# Patient Record
Sex: Female | Born: 1985 | Race: Black or African American | Hispanic: No | Marital: Single | Smoking: Never smoker
Health system: Southern US, Community
[De-identification: ages and names within clinical notes are randomized; demographics above are authoritative.]

## PROBLEM LIST (undated history)

## (undated) DIAGNOSIS — N289 Disorder of kidney and ureter, unspecified: Secondary | ICD-10-CM

---
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
## 2006-02-09 ENCOUNTER — Ambulatory Visit: Payer: Self-pay | Admitting: Family Medicine

## 2006-02-18 ENCOUNTER — Ambulatory Visit: Payer: Self-pay | Admitting: Family Medicine

## 2006-02-18 ENCOUNTER — Inpatient Hospital Stay (HOSPITAL_COMMUNITY): Admission: AD | Admit: 2006-02-18 | Discharge: 2006-02-22 | Payer: Self-pay | Admitting: Family Medicine

## 2006-02-23 IMAGING — US US OB FOLLOW-UP
1 series · 13 of 28 positions shown · non-contrast
Comparison: none

CLINICAL DATA: 39 week 0 day assigned gestational age.  Small habitus.  Measuring small for dates.

[Series 1: us ob follow-up · 0.33mm/px · 13 of 28 slices shown]
[im 2/28]
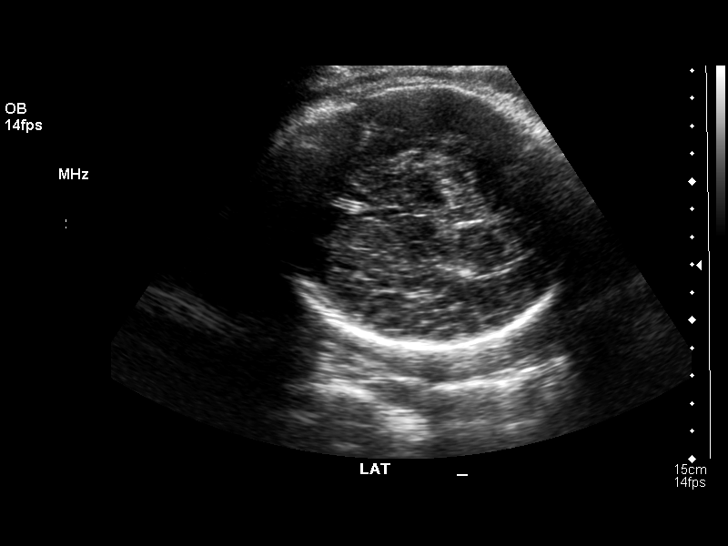
[im 4/28]
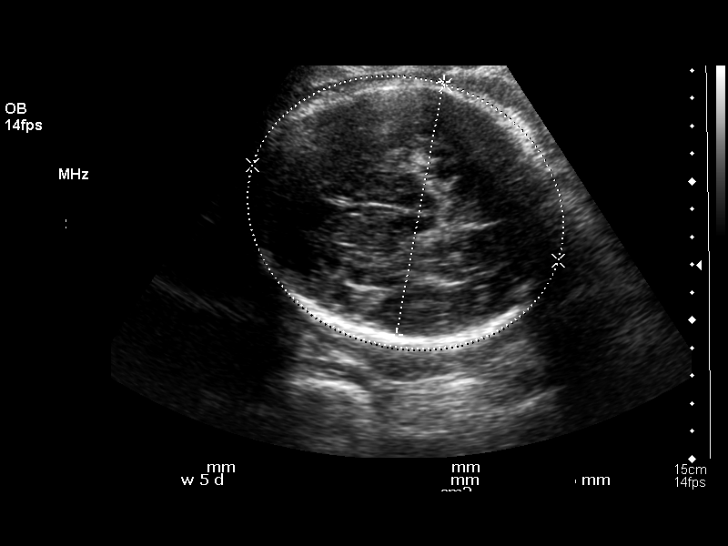
[im 6/28]
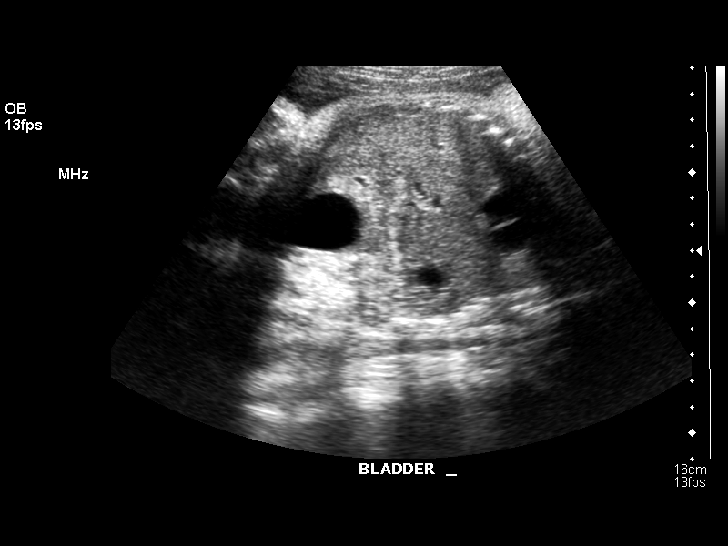
[im 8/28]
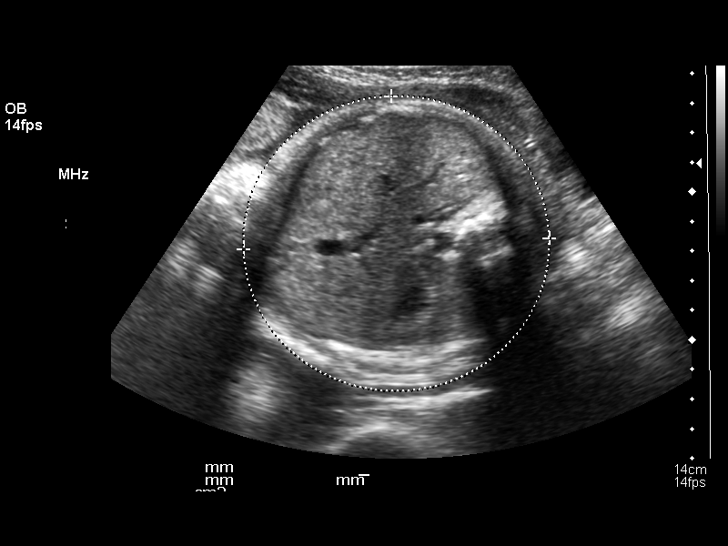
[im 10/28]
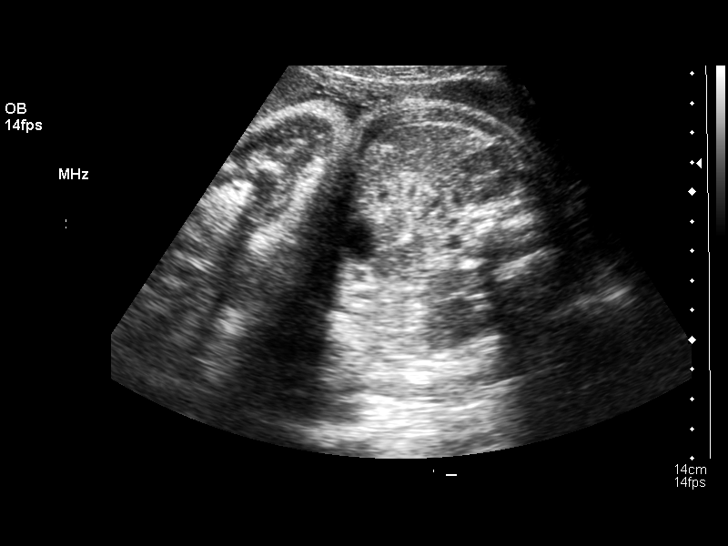
[im 12/28]
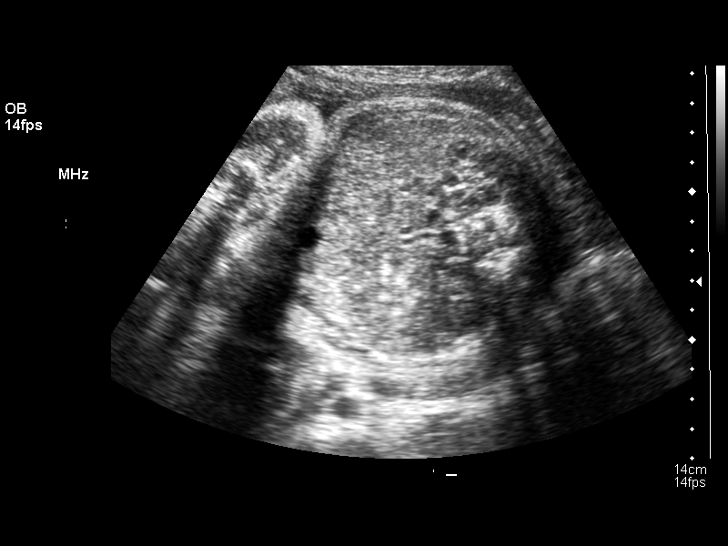
[im 15/28]
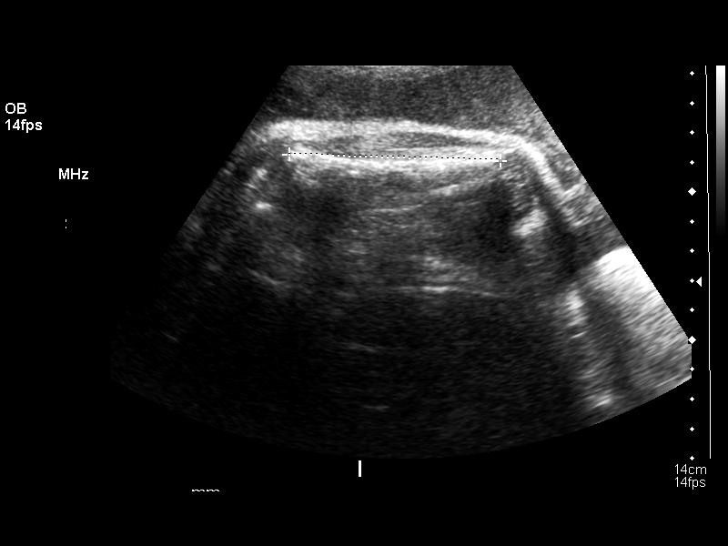
[im 17/28]
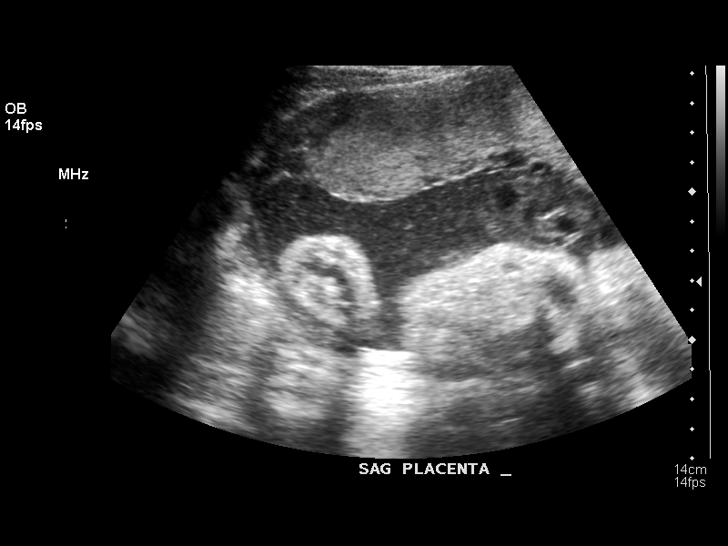
[im 19/28]
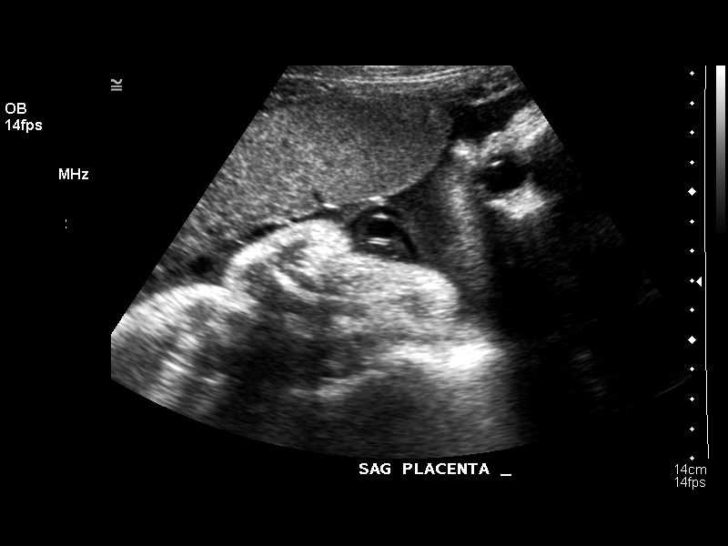
[im 21/28]
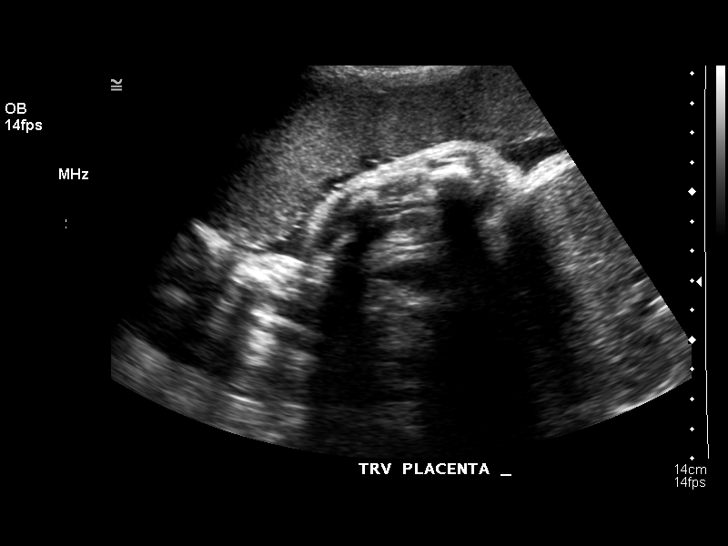
[im 23/28]
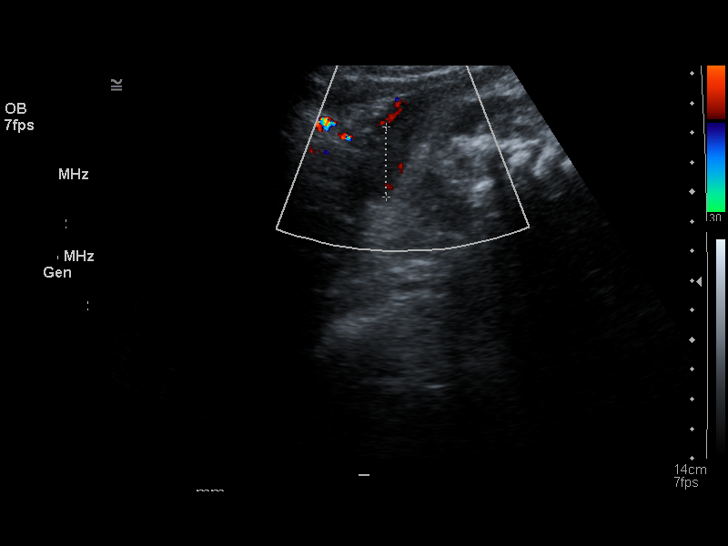
[im 25/28]
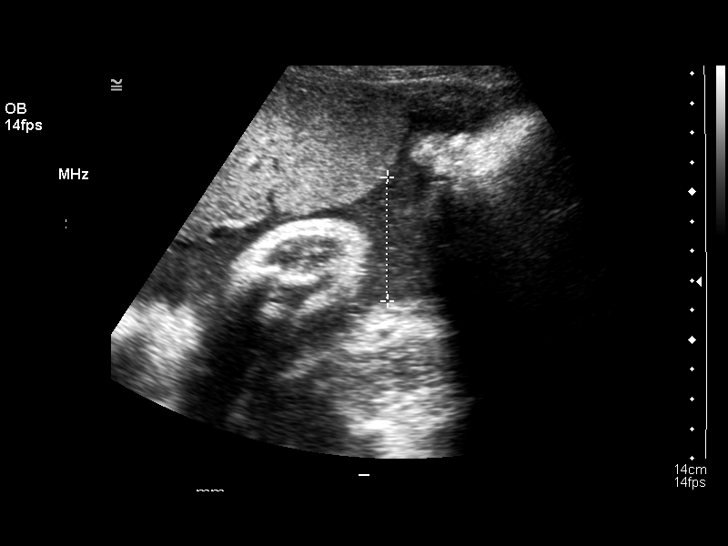
[im 27/28]
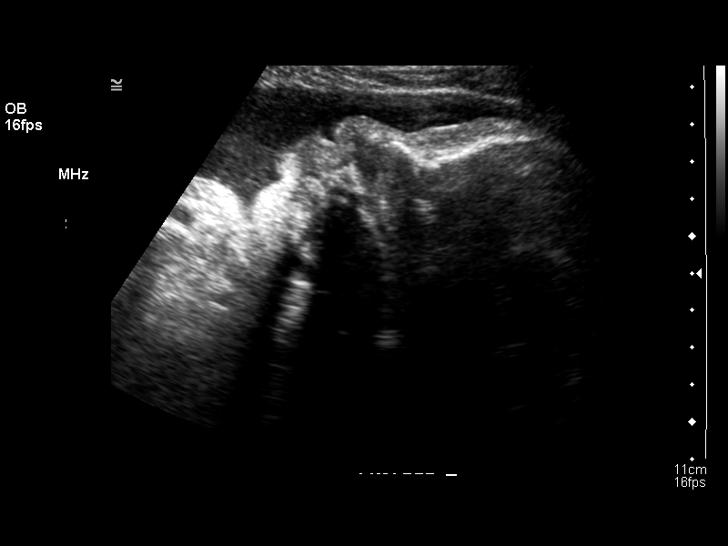

[13 of 28 positions shown; findings below may reference images not displayed]

OBSTETRICAL ULTRASOUND RE-EVALUATION:
Number of Fetuses:  1
Heart Rate:  160
Movement:  Yes
Breathing:  No
Presentation:  Cephalic
Placental Location:  Anterior
Grade: I
Previa:  No
Amniotic Fluid (subjective):  Normal
Amniotic Fluid (objective):  12.9 cm AFI (5th -95th%ile = 7.2 ? 22.6 cm for 39 wks)

FETAL BIOMETRY
BPD:  9.3 cm  37 w 6 d
HC:  33.9 cm   38 w 6 d
AC:  32.3 cm   36 w 2 d
FL:  7.1 cm  36 w 2 d

Mean GA:  37 w 2 d  US EDC:  12/22/05
Assigned GA:  39 w 0 d  Assigned EDC:  12/10/05

EFW:  9223 g (H) 25th ? 50th%ile (9404 ? 7700 g) For 39 wks

FETAL ANATOMY
Lateral Ventricles:  Visualized 
Thalami/CSP:  Visualized 
Posterior Fossa:  Previously seen 
Nuchal Region:  Previously seen 
Spine:  Previously seen 
4 Chamber Heart on Left:  Previously seen 
Stomach on Left:  Visualized 
3 Vessel Cord:  Previously seen 
Cord Insertion Site:  Previously seen 
Kidneys:  Visualized 
Bladder:  Visualized 
Extremities:  Previously seen 

MATERNAL UTERINE AND ADNEXAL FINDINGS
Cervix:  Not evaluated > 37 wks
IMPRESSION: 1.  Assigned gestational age is currently 39 weeks 0 days.  Today?s measurements are approximately two weeks behind, with EFW at 25th percentile.
2.  Amniotic fluid volume within normal limits with AFI of 12.9 cm.

## 2006-08-21 ENCOUNTER — Emergency Department (HOSPITAL_COMMUNITY): Admission: EM | Admit: 2006-08-21 | Discharge: 2006-08-21 | Payer: Self-pay | Admitting: Emergency Medicine

## 2006-08-24 ENCOUNTER — Ambulatory Visit: Payer: Self-pay | Admitting: Family Medicine

## 2007-08-03 ENCOUNTER — Telehealth (INDEPENDENT_AMBULATORY_CARE_PROVIDER_SITE_OTHER): Payer: Self-pay | Admitting: *Deleted

## 2007-08-03 ENCOUNTER — Ambulatory Visit: Payer: Self-pay | Admitting: Sports Medicine

## 2007-09-17 ENCOUNTER — Emergency Department (HOSPITAL_COMMUNITY): Admission: EM | Admit: 2007-09-17 | Discharge: 2007-09-17 | Payer: Self-pay | Admitting: Emergency Medicine

## 2007-09-19 ENCOUNTER — Telehealth: Payer: Self-pay | Admitting: *Deleted

## 2007-12-15 ENCOUNTER — Ambulatory Visit: Payer: Self-pay | Admitting: Family Medicine

## 2007-12-15 ENCOUNTER — Telehealth: Payer: Self-pay | Admitting: *Deleted

## 2007-12-15 ENCOUNTER — Encounter (INDEPENDENT_AMBULATORY_CARE_PROVIDER_SITE_OTHER): Payer: Self-pay | Admitting: Family Medicine

## 2007-12-15 LAB — CONVERTED CEMR LAB: Rapid Strep: POSITIVE

## 2008-06-14 ENCOUNTER — Telehealth (INDEPENDENT_AMBULATORY_CARE_PROVIDER_SITE_OTHER): Payer: Self-pay | Admitting: *Deleted

## 2008-09-10 ENCOUNTER — Encounter: Payer: Self-pay | Admitting: Family Medicine

## 2008-09-10 ENCOUNTER — Encounter (INDEPENDENT_AMBULATORY_CARE_PROVIDER_SITE_OTHER): Payer: Self-pay | Admitting: Family Medicine

## 2008-09-10 ENCOUNTER — Other Ambulatory Visit: Admission: RE | Admit: 2008-09-10 | Discharge: 2008-09-10 | Payer: Self-pay | Admitting: Family Medicine

## 2008-09-10 ENCOUNTER — Ambulatory Visit: Payer: Self-pay | Admitting: Family Medicine

## 2008-09-10 LAB — CONVERTED CEMR LAB
Blood in Urine, dipstick: NEGATIVE
Urobilinogen, UA: 0.2
pH: 6

## 2008-09-11 LAB — CONVERTED CEMR LAB: Chlamydia, DNA Probe: NEGATIVE

## 2008-09-15 ENCOUNTER — Emergency Department (HOSPITAL_COMMUNITY): Admission: EM | Admit: 2008-09-15 | Discharge: 2008-09-15 | Payer: Self-pay | Admitting: Emergency Medicine

## 2008-09-17 ENCOUNTER — Telehealth (INDEPENDENT_AMBULATORY_CARE_PROVIDER_SITE_OTHER): Payer: Self-pay | Admitting: *Deleted

## 2008-09-17 ENCOUNTER — Ambulatory Visit: Payer: Self-pay | Admitting: Family Medicine

## 2008-11-01 ENCOUNTER — Telehealth: Payer: Self-pay | Admitting: Family Medicine

## 2009-06-27 ENCOUNTER — Emergency Department (HOSPITAL_COMMUNITY): Admission: EM | Admit: 2009-06-27 | Discharge: 2009-06-27 | Payer: Self-pay | Admitting: Emergency Medicine

## 2009-07-12 ENCOUNTER — Emergency Department (HOSPITAL_COMMUNITY): Admission: EM | Admit: 2009-07-12 | Discharge: 2009-07-13 | Payer: Self-pay | Admitting: Emergency Medicine

## 2009-12-18 ENCOUNTER — Telehealth (INDEPENDENT_AMBULATORY_CARE_PROVIDER_SITE_OTHER): Payer: Self-pay | Admitting: *Deleted

## 2009-12-24 ENCOUNTER — Ambulatory Visit: Payer: Self-pay | Admitting: Family Medicine

## 2009-12-24 LAB — CONVERTED CEMR LAB
Bilirubin Urine: NEGATIVE
Nitrite: NEGATIVE
Specific Gravity, Urine: 1.025
WBC Urine, dipstick: NEGATIVE

## 2010-05-15 ENCOUNTER — Encounter: Payer: Self-pay | Admitting: Family Medicine

## 2010-06-30 ENCOUNTER — Emergency Department (HOSPITAL_COMMUNITY): Admission: EM | Admit: 2010-06-30 | Discharge: 2010-06-30 | Payer: Self-pay | Admitting: Emergency Medicine

## 2010-12-20 ENCOUNTER — Encounter: Payer: Self-pay | Admitting: Sports Medicine

## 2010-12-29 NOTE — Progress Notes (Signed)
Summary: Rx Req  Phone Note Refill Request Call back at Home Phone [phone] Message from:  Patient  Refills Requested: Medication #1:  KEFLEX 500 MG CAPS 1 tab by mouth two times a day for 5 days WONDERING IF SHE CAN GET AN RX FOR THIS HAVING SAME SYMPTOMS AS BEFORE WHEN SHE GOT THIS.  PT USES [facility] ON [address]  Initial call taken by: Clydell Hakim,  December 18, 2009 9:04 AM Caller: Patient  Follow-up for Phone Call        patient reports she is having back pain similar to before when she had UTI. states back sort of throbbs and feels hot. denies any discomfort with urination  or fever. appointment scheduled today for Work In appointment. Follow-up by: Theresia Lo RN,  December 18, 2009 11:14 AM

## 2010-12-29 NOTE — Assessment & Plan Note (Signed)
Summary: ?UTI, check IUD   Vital Signs:  Patient profile:   25 year old female Weight:      120.2 pounds BMI:     20.55 Temp:     98.2 degrees F oral Pulse rate:   71 / minute Pulse rhythm:   regular BP sitting:   113 / 80 Cuff size:   regular  Vitals Entered By: Loralee Pacas CMA (December 24, 2009 2:34 PM) CC: dysuria Comments would like for her IUD to be checked    Primary Care Provider:  Lequita Asal  MD  CC:  dysuria.  History of Present Illness: 25 y/o female with h/o pyelonephritis and numerous UTI who presents with lower back pain. denies fever, chills, dysuria, abdominal pain, N/V.   contraceptive mgmt- patient unable to feel IUD strings. would like it checked.   Current Medications (verified): 1)  None  Allergies (verified): No Known Drug Allergies  Past History:  Past medical history reviewed for relevance to current acute and chronic problems.  Past Medical History: Reviewed history from 09/10/2008 and no changes required. 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 vitals reviewed.  Abdomen:  Bowel sounds positive,abdomen soft and non-tender without masses, organomegaly or hernias noted. no CVA tenderness bilaterally Genitalia:  Normal introitus for age, no external lesions, blood in introitus. mucosa pink and moist, no vaginal or cervical lesions, no vaginal atrophy, no friaility or hemorrhage, normal uterus size and position, no adnexal masses or tenderness. IUD strings in place.    Impression & Recommendations:  Problem # 1:  BACK PAIN (ICD-724.5) Assessment New  u/a and history not consistent with UTI or pyelonephritis. patient with h/o renal colic and pyelo. red flags given. f/u as needed  Orders: [facility]- Est Level  3 (16109)  Problem #  2:  CONTRACEPTIVE MANAGEMENT (ICD-V25.09)  IUD strings visualized. IUD to be removed 2012.   Orders: [facility]- Est Level  3 (60454)  Other Orders: Urinalysis-[facility] (00000)  Patient Instructions: 1)  If you develop fever, chills, nausea, vomiting, severe back pain, pain with urination, or other concerns, let us know right away.   Laboratory Results   Urine Tests  Date/Time Received: December 24, 2009 2:35 PM  Date/Time Reported: December 24, 2009 3:27 PM   Routine Urinalysis   Color: yellow Appearance: Clear Glucose: negative   (Normal Range: Negative) Bilirubin: negative   (Normal Range: Negative) Ketone: trace (5)   (Normal Range: Negative) Spec. Gravity: 1.025   (Normal Range: 1.003-1.035) Blood: trace-lysed   (Normal Range: Negative) pH: 5.5   (Normal Range: 5.0-8.0) Protein: trace   (Normal Range: Negative) Urobilinogen: 1.0   (Normal Range: 0-1) Nitrite: negative   (Normal Range: Negative) Leukocyte Esterace: negative   (Normal Range: Negative)  Urine Microscopic WBC/HPF: 0-3 RBC/HPF: 1-5 Bacteria/HPF: 1+ Mucous/HPF: 2+ Epithelial/HPF: 10-20  with few clue appearing cells    Comments: ...............test performed by......Marland KitchenBonnie A. [place], MLS (ASCP)cm

## 2010-12-29 NOTE — Miscellaneous (Signed)
  Clinical Lists Changes  Problems: Removed problem of CONTRACEPTIVE MANAGEMENT (ICD-V25.09) Removed problem of BACK PAIN (ICD-724.5) Removed problem of GYNECOLOGICAL EXAMINATIONOUTINE (ICD-V72.31) Removed problem of SCREENING FOR MALIGNANT NEOPLASM(ICD-V76.2)

## 2011-01-20 ENCOUNTER — Encounter: Payer: Self-pay | Admitting: Sports Medicine

## 2011-01-20 ENCOUNTER — Ambulatory Visit (INDEPENDENT_AMBULATORY_CARE_PROVIDER_SITE_OTHER): Payer: Self-pay | Admitting: Sports Medicine

## 2011-01-20 VITALS — BP 113/72 | HR 85 | Temp 97.8°F | Ht 64.0 in | Wt 124.0 lb

## 2011-01-20 DIAGNOSIS — Z309 Encounter for contraceptive management, unspecified: Secondary | ICD-10-CM

## 2011-01-20 DIAGNOSIS — Z975 Presence of (intrauterine) contraceptive device: Secondary | ICD-10-CM | POA: Insufficient documentation

## 2011-01-20 NOTE — Progress Notes (Signed)
Pt arrived for mirena removal/insertion.  Medicaid auth not completed.  Pt agrees to return once completed.

## 2011-01-20 NOTE — Assessment & Plan Note (Signed)
Pt will RTC when authorization completed and I can remove old mirena and replace with new one.

## 2011-02-12 LAB — WET PREP, GENITAL: Trich, Wet Prep: NONE SEEN

## 2011-02-12 LAB — BASIC METABOLIC PANEL
BUN: 7 mg/dL (ref 6–23)
CO2: 25 mEq/L (ref 19–32)
Calcium: 8.7 mg/dL (ref 8.4–10.5)
Creatinine, Ser: 0.71 mg/dL (ref 0.4–1.2)
Glucose, Bld: 84 mg/dL (ref 70–99)

## 2011-02-12 LAB — URINALYSIS, ROUTINE W REFLEX MICROSCOPIC
Bilirubin Urine: NEGATIVE
Glucose, UA: NEGATIVE mg/dL
Hgb urine dipstick: NEGATIVE
Ketones, ur: NEGATIVE mg/dL
Nitrite: NEGATIVE
Protein, ur: NEGATIVE mg/dL
Specific Gravity, Urine: 1.015 (ref 1.005–1.030)
Urobilinogen, UA: 1 mg/dL (ref 0.0–1.0)
pH: 7 (ref 5.0–8.0)

## 2011-02-12 LAB — DIFFERENTIAL
Eosinophils Relative: 6 % — ABNORMAL HIGH (ref 0–5)
Monocytes Relative: 5 % (ref 3–12)
Neutro Abs: 7.1 10*3/uL (ref 1.7–7.7)
Neutrophils Relative %: 71 % (ref 43–77)

## 2011-02-12 LAB — GC/CHLAMYDIA PROBE AMP, GENITAL
Chlamydia, DNA Probe: NEGATIVE
GC Probe Amp, Genital: NEGATIVE

## 2011-02-12 LAB — CBC: WBC: 10.1 10*3/uL (ref 4.0–10.5)

## 2011-03-03 ENCOUNTER — Other Ambulatory Visit (HOSPITAL_COMMUNITY)
Admission: RE | Admit: 2011-03-03 | Discharge: 2011-03-03 | Disposition: A | Discharge status: Home / Self Care | Payer: Medicaid Other | Source: Ambulatory Visit | Attending: Family Medicine | Admitting: Family Medicine

## 2011-03-03 ENCOUNTER — Encounter: Payer: Self-pay | Admitting: Family Medicine

## 2011-03-03 ENCOUNTER — Ambulatory Visit (INDEPENDENT_AMBULATORY_CARE_PROVIDER_SITE_OTHER): Payer: Medicaid Other | Admitting: Family Medicine

## 2011-03-03 DIAGNOSIS — Z30433 Encounter for removal and reinsertion of intrauterine contraceptive device: Secondary | ICD-10-CM

## 2011-03-03 DIAGNOSIS — Z01419 Encounter for gynecological examination (general) (routine) without abnormal findings: Secondary | ICD-10-CM | POA: Insufficient documentation

## 2011-03-03 DIAGNOSIS — Z975 Presence of (intrauterine) contraceptive device: Secondary | ICD-10-CM

## 2011-03-03 DIAGNOSIS — Z3009 Encounter for other general counseling and advice on contraception: Secondary | ICD-10-CM

## 2011-03-03 DIAGNOSIS — Z124 Encounter for screening for malignant neoplasm of cervix: Secondary | ICD-10-CM

## 2011-03-03 MED ORDER — LEVONORGESTREL 20 MCG/24HR IU IUD
INTRAUTERINE_SYSTEM | Freq: Once | INTRAUTERINE | Status: AC
  Administered 2011-03-03: 1 via INTRAUTERINE

## 2011-03-03 NOTE — Assessment & Plan Note (Signed)
Mirena replaced today.  Pap performed.  See patient instructions.  Follow-up in 4 weeks for recheck

## 2011-03-03 NOTE — Progress Notes (Signed)
  Subjective:    Patient ID: [name], female    DOB: [birth year], 25 y.o.   MRN: [id number]  HPI  Here to replace mirena.  Had placed 5 years ago.  Has been happy.  Would like to replace with another Mirena.  Having monthly very light periods.  No pelvic pain.  No history of PID or STD's.    Review of Systems  see hpi    Objective:   Physical Exam  Constitutional: She appears well-developed and well-nourished.  Abdominal: Soft. Bowel sounds are normal. She exhibits no distension and no mass. There is no tenderness. There is no rebound and no guarding.  Genitourinary: Vagina normal and uterus normal. Cervix exhibits discharge. Cervix exhibits no motion tenderness and no friability. Right adnexum displays no mass, no tenderness and no fullness. Left adnexum displays no mass, no tenderness and no fullness. No tenderness or bleeding around the vagina. No vaginal discharge found.       Mirena strings seen exiting from cervical os          Assessment & Plan:  Placement of Mirena IUD  Indication: Contraception Assisted by: Huntley Dec CNA  Patient was counseled regarding the risks/benefits/alternatives of the IUD. Currently has a Mirena IUD in place. Consent was obtained and all questions answered.  Time out performed. Pap performed prior to procedure.  Description: Cervix was swabbed three times with Betadine swabs. Sterile gloves donned. Sterile single-tooth tenaculum used to grasp anterior lip of the cervix and straighten the endocervical canal. mirena strings grasps with ring forceps and removed. Uterus sounded to 7 cm. IUD loaded per manufacturer's instruction and flange set to 7 cm. IUD placed per manufacturer's directions. Strings trimmed to 4 cm. Patient tolerated the procedure well.    Follow-up: Patient counseled regarding techniques for self-monitoring of IUD and given precautions. Patient notified of removal date and given card. Patient will follow-up in 4 weeks  for string check.

## 2011-03-03 NOTE — Patient Instructions (Signed)
IUD AFTERCARE  1.  Uterine cramping is common after IUD placement.  You  May using heating pads, ibuprofen, and tylenol.  If it becomes very painful, you notice fever or chills, unusual bleeding, or foul smelling vaginal discharge please call the office. 2.  Irregular vaginal bleeding is common in the first few months after IUD placement but will often get better in the first six months.   3.  IUDs do not protect against STD such as HIV, genital warts, gonorrhea, chlamydia, herpes.  Please continue to protect yourself against these infections and contact the office if you think you may have contracted an infection. 4.  Checking for proper placement:  You may feel for your IUD strings as shown today by placing you fingers into your vagina.  Do not pull on the strings.  If your Mirena falls out, you can feel the hard plastic part of the IUD, or you can no longer feel the strings, please contact the office. 5.  Your Mirena should be removed or replaced in 5 years. 6.  Please see package insert or [email or web address] for full patient information. 7.  Make follow-up appointment for 4 weeks.  

## 2011-03-04 NOTE — Progress Notes (Signed)
Addended by: Delbert Harness on: 03/04/2011 09:20 AM   Modules accepted: Level of Service

## 2011-03-07 LAB — URINE MICROSCOPIC-ADD ON

## 2011-03-07 LAB — URINE CULTURE: Colony Count: 9000

## 2011-03-07 LAB — URINALYSIS, ROUTINE W REFLEX MICROSCOPIC
Bilirubin Urine: NEGATIVE
Glucose, UA: NEGATIVE mg/dL
Ketones, ur: NEGATIVE mg/dL
Ketones, ur: NEGATIVE mg/dL
Nitrite: NEGATIVE
Nitrite: NEGATIVE
Specific Gravity, Urine: 1.023 (ref 1.005–1.030)
Urobilinogen, UA: 1 mg/dL (ref 0.0–1.0)
Urobilinogen, UA: 1 mg/dL (ref 0.0–1.0)

## 2011-03-07 LAB — POCT I-STAT, CHEM 8
BUN: 10 mg/dL (ref 6–23)
Calcium, Ion: 1.16 mmol/L (ref 1.12–1.32)
Creatinine, Ser: 0.9 mg/dL (ref 0.4–1.2)
Glucose, Bld: 88 mg/dL (ref 70–99)
Potassium: 3.7 mEq/L (ref 3.5–5.1)
Sodium: 141 mEq/L (ref 135–145)
TCO2: 25 mmol/L (ref 0–100)

## 2011-03-07 LAB — PREGNANCY, URINE: Preg Test, Ur: NEGATIVE

## 2011-03-09 ENCOUNTER — Encounter: Payer: Self-pay | Admitting: Family Medicine

## 2011-04-16 NOTE — Discharge Summary (Signed)
NAMECASILDA, [name]                       ACCOUNT NO.:  [id number]   MEDICAL RECORD NO.:  [id number]                   PATIENT TYPE:  INP   LOCATION:  6153                                 FACILITY:  [facility]   PHYSICIAN:  Orie Rout, M.D.            DATE OF BIRTH:   DATE OF ADMISSION:  06/14/2003  DATE OF DISCHARGE:  06/24/2003                                 DISCHARGE SUMMARY   PRIMARY CARE PHYSICIAN:  Penni Bombard, M.D.   REFERRING PHYSICIAN:  None.   CONSULTING PHYSICIAN:  Dr. Dineen Kid, [facility].   FINAL DIAGNOSES:  1. Pyelonephritis.  2. Possible gram-negative urosepsis.  3. Possible toxic shock syndrome.  4. Ureteropelvic junction urethral stricture.  5. Rhabdomyolysis secondary to numbers 2 and 3.   PRINCIPAL PROCEDURES:  1. A renal ultrasound showing mild dilation of the collecting system     consistent with pelvic calyces or mild hydronephrosis.  It also showed     that the right kidney had a triangulated perinephritic fluid collection     which had apparent internal debris and that the left kidney was mildly     enlarged.  The fluid collection, according to radiology, was concerning     for an abscess.  2. Also abdominal and pelvic CT showing a left ureteropelvic junction     stenosis with secondary pyelocaliectasis with superimposed pyelonephritis     with edematous changes of the left kidney and wedge-shaped cortical     defects.   ADMISSION HISTORY AND PHYSICAL:  Please see records.   LABORATORY DATA:  At the time of discharge the patient had a CK value of  8,232 and a creatinine of 0.9, and her white blood cell count had  normalized.   HOSPITAL COURSE:  Ms. [name] is a 25 year old African-American  female who presented with crampy pain two days prior to admission.  She had  had none episode of encopresis described by mom as being coffee-ground in  appearance.  She had also had one episode of encopresis two days prior to  admission.  She reported fevers to 24 at the day of admission and these  fevers were accompanied with a sharp, colicky pain in the left stomach that  seemed to radiate around to her back.  She endorsed decreased appetite and  decreased urination; denied pain with urination, denied seeing any blood in  her urine or stool.  Endorsed good genital hygiene and states that she wipes  from front to back.  She was not sexually active.  Endorsed fatigue with no  cough, no sore throat, no congestion, no joint pain.  On physical exam had  significant CVA tenderness and was febrile to 24.4.  A UA performed at the  time of admission showed positive ketones, positive protein, positive  nitrates, positive leukocyte esterase, positive bacteria.  Negative for  glucose.  She had a negative pregnancy test and had a WBC on presentation of  29.7.  with predominant neutrophils.  Her creatinine at admission 1.8 so she  was started on ceftriaxone and was doing fine during the evening of the day  after admission when she spiked to 103 and became hypotensive.  She at this  point in time was transferred to the PICU where she was aggressively  rehydrated and required several hours worth of epinephrine drip in order to  raise her blood pressure to an adequate level.  During the time in the PICU  on the epinephrine drip she did have several-minute runs of V-TACH which  resolved after stopping the epinephrine drip.  On transfer to the PICU,  additional antibiotics were started.  She was placed on gentamycin and  doxycycline and also clindamycin with the thought of possible toxic shock  syndrome.  At the time when she was transferred to the PICU, it was found  that her platelets had dropped from 126 to 98, she had a fibrinogen leve of  693 and a D-dimer of 3.43, a PT of 13.1, an INR of 1.1, and a PTT of 46.  Her CK at that time was 7956 and a CRP of 197.5.  It was thought at this  point that she either had gram-negative  sepsis or toxic shock syndrome,  however, her blood cultures never grew out any bacteria.  Her urine did grow  E. coli.  Also in the PICU she developed an oxygen requirement and had a  chest x-ray performed that showed a left pleural effusion involving almost  the entire left lung and was given several doses of Lasix which produced a  large diuresis and decreased her oxygen requirement.  After several days in  the PICU, she began to clinically improve and was transferred back to the  floor at which time her CK continued to climb and reached a peak of 32,000.  Aggressive hydration was provided as well as alkalinization of the urine.  Her acute renal failure resolved with a peak creatinine being 2.2 and was  0.9 at the time of discharge.  Three days before discharge, a pelvic and  abdominal CT was performed showing the UPJ stenosis.  At this time a urology  consult was performed.  The urologist believes that it was not necessary to  stent the stricture, but that he would like to follow her up as an  outpatient.  He felt very comfortable with her going home on p.o.  antibiotics.  On the day of discharge the patient had no CVA tenderness, had  been afebrile for nearly 72 hours, was tolerating p.o. intake well, and the  renal function was greatly improved and back to baseline at 0.9.  CK had  decreased remarkably down to 3232, and at that time all other antibiotics  had been discontinued and she was tolerating oral ciprofloxacin well.   INSTRUCTIONS TO PATIENT AND FAMILY:  The patient's mother was instructed to  return to clinic or the ED if the patient developed a fever, recurrent flank  pain or became otherwise ill appearing.  She was instructed to take  ciprofloxacin 500 mg p.o. b.i.d. for 10 days.  She was instructed to  continue good hydration and to maintain a normal diet.   FOLLOW UP:  The patient will follow up with myself, Dr. Melvyn Novas, on this Friday, June 28, 2003, at 3:45.  The  patient will also schedule an  appointment with Dr. Dineen Kid in urology in one month for further  evaluation of her stricture.  DISCHARGE MEDICATIONS:  Ciprofloxacin 500 mg p.o. b.i.d. x10d.   ADVANCED DIRECTIVE:  None.   DATA REVIEWED:  Full Code.      Penni Bombard, MD                          Orie Rout, M.D.    SJ/MEDQ  D:  06/24/2003  T:  06/24/2003  Job:  [id number]   cc:   Dr. Franc[facility]

## 2011-04-16 NOTE — Discharge Summary (Signed)
[name], [name]             ACCOUNT NO.:  [id number]   MEDICAL RECORD NO.:  [id number]          PATIENT TYPE:  INP   LOCATION:  5033                         FACILITY:  [facility]   PHYSICIAN:  Benn Moulder, M.D.      DATE OF BIRTH:  [birth year]   DATE OF ADMISSION:  02/18/2006  DATE OF DISCHARGE:  02/22/2006                                 DISCHARGE SUMMARY   DISCHARGE DIAGNOSES:  1.  Pyelonephritis secondary to Escherichia coli.  2.  Ureteropelvic junction stenosis.  3.  Post-partum and breast feeding.  4.  Hypokalemia.  5.  Dehydration.   DISCHARGE MEDICATIONS:  1.  Amoxicillin 500 mg t.i.d. through March 04, 2006.  2.  Prenatal vitamin once daily while breast feeding.  3.  Tylenol 650 mg p.o. q.6 h. p.r.n. pain.   LABORATORY DATA:  On admission, white blood cell count 17.2, hemoglobin  12.2, hematocrit 35.4, platelet count 153. Sodium 136, potassium 2.9,  chloride 103, bicarb 25, BUN 10, creatinine 1.0, glucose 100. LFTs were  within normal limits. Urine Gram's stain positive for Gram-negative rods.  Urine culture was positive for E. coli, pansensitive. White blood cell count  at discharge 6.9, hemoglobin at discharge 11.3, creatinine at discharge was  0.8.   HISTORY OF PRESENT ILLNESS:  The patient is an 25 year old female with a  history of pyelonephritis x2, as well as a previous episode of septic shock  secondary to E. coli with her first admission for pyelonephritis, who is  also noted to have left-sided UPJ stenosis, who presented to the [facility] with fevers, right-sided low back pain, and cloudy urine.  She also had nausea, vomiting, fever, chills, and was unable to tolerate any  foods or liquids. She is febrile on admission with a temperature of 102.5.  Urine had 10 to 20 white blood cells. The patient was admitted to the [facility] for diagnosis of pyelonephritis.   HOSPITAL COURSE BY PROBLEM:  #1.  PYELONEPHRITIS:  The patient  remained  febrile, reaching a temperature max of 103.8. She was started on ceftriaxone  1 gram IV q.24 h. The patient's fever curve gradually decreased with the  patient's last fever at 2:00 p.m. on February 20, 2006. The patient remained  afebrile throughout the remainder of her hospital course. The patient also  with significant nausea and vomiting on admission. This gradually improved  and for the last 2 days of admission, the patient was tolerating a regular  diet. The patient did receive Zofran as needed during the first couple of  days of her admission. The patient also with left-sided flank pain, which  again continued to improve and was gone by the time of her discharge. The  patient was continued on IV ceftriaxone until urine cultures were returned.  The patient's urine culture was positive for E. coli and was pansensitive.  The patient was changed to amoxicillin on the day of discharge. The patient  will need to complete a 2-week course of antibiotics and will take  amoxicillin 500 mg t.i.d. as an outpatient. Given the patient's history of  multiple pyelonephritis as well as UPJ stenosis, would recommend followup  with Urology as an outpatient. Also will consider prophylactic antibiotic  therapy as an outpatient.   #2.  POSTPARTUM:  The patient with a healthy young boy. She is breast  feeding. She continued breast feeding and pumping during this hospital stay.  The patient was instructed to continue taking multivitamin while breast  feeding.   #3.  HYPOKALEMIA:  The patient's potassium of 2.9 on admission. This was  replaced orally and remained stable throughout the rest of her hospital  admission.   #4.  FLUID AND ELECTROLYTES:  The patient with nausea and vomiting  initially. At the point of discharge, the patient was tolerating a regular  diet with good oral intake and urine output.   FOLLOW UP:  Dr. Melvyn Novas at [facility]. Phone number [phone]. The  patient will call for an appointment.      Benn Moulder, M.D.     MR/MEDQ  D:  02/22/2006  T:  02/23/2006  Job:  [id number]   cc:   Pearlean Brownie, M.D.  Fax: [phone]   Penni Bombard, MD  Fax: [phone]

## 2011-04-16 NOTE — Discharge Summary (Signed)
Nguyen, [name]             ACCOUNT NO.:  [id number]   MEDICAL RECORD NO.:  [id number]          PATIENT TYPE:  INP   LOCATION:  9309                          FACILITY:  [facility]   PHYSICIAN:  Penni Bombard, MD       DATE OF BIRTH:  1986-11-13   DATE OF ADMISSION:  05/28/2005  DATE OF DISCHARGE:  05/30/2005                                 DISCHARGE SUMMARY   FINAL DIAGNOSES:  1.  Left-sided pyelonephritis.  2.  Intrauterine pregnancy at 12-2/7 weeks.   PRINCIPAL PROCEDURE:  Renal ultrasound showing moderate left renal  pelvocaliectasis which is a new finding since her last renal ultrasound.   ADMISSION HISTORY AND PHYSICAL:  Please see chart for full details.  However, in short this is a 25 year old [place] American female with history  of UPJ stenosis with shock in 2004.  The patient developed fever to 102.7 on  Wednesday with nausea and left lower quadrant pain.  The patient had prior  urine culture on June 19, but did not fill her antibiotic prescription. The  patient had a history of septic shock and ventricular fibrillation with her  last episode of pyelonephritis in July 2004.  The patient presented to my  office on the day of admission, June 30, with complain of left lower  quadrant abdominal pain and left-sided pain with inspiration.  She had been  taking persistent Tylenol when I had seen her and was currently afebrile.  The patient's weight was down three pounds.  The denied dysuria or frequency  or urgency but had not had any other previous UTIs either.   LABORATORY DATA:  WBC 11.3, hemoglobin 10.7, hematocrit 31.9, platelets 258.  Sodium 140, potassium 3.1, chloride 100, CO2 27, BUN 5, creatinine 0.5, AST  10, ALT 8, alk phos 67, total bilirubin 0.7, total protein 6.7, albumin 2.8.  Urinalysis at the hospital reveals negative nitrites, small leukocyte  esterase, ketones greater than 80, bloods negative, wbc's 11-20, bacteria  few. Urine culture was no growth to date at  the time of discharge.   HOSPITAL COURSE:  1.  Pyelonephritis.  The patient was admitted and started on ceftriaxone IV      1 g q.12h. and given aggressive IV hydration.  She was given Phenergan      for nausea and vomiting.  By day 2, she was able to eat a regular diet      and she was continued on ceftriaxone.  She remained afebrile through her      hospital course.  Renal ultrasound revealed calyx dilatation on the      left.  We spoke with urology who stated that they would not currently      stunt the patient but would like her to follow up with them as an      outpatient.  By the end of day  of hospitalizations, she continued to be      afebrile and was discharged on a course of Ceftin for a total of a 14-      day course.  At the time of discharge the patient's pain  had completely      dissipated.  The patient will continue on suppression course of Macrobid      through the remainder of her pregnancy.  2.  Intrauterine pregnancy.  The patient's fetal heart rate was monitored      throughout the pregnancy. Fetal heart tones were in the 170's to 180's.      She had no bleeding or leakage of fluids throughout the hospitalization.   DISCHARGE INSTRUCTIONS:  Instructions to the patient and family:  The  patient was instructed to check her temperature every four hours for the  next several days except while sleeping.  If she had a temperature of  greater than 100, she was to return to the hospital for repeat admission.  The patient was instructed to follow up with her urologist in the next one  to two weeks.  She was also instructed to follow up with myself, Dr. Penni Bombard, within three weeks.  She was instructed to complete a total of 14  days on Ceftin and then the continue on suppressive therapy with Macrobid.   DISCHARGE MEDICATIONS:  1.  Ceftin 500 mg one tablet p.o. b.i.d. for 12 days.  2.  Macrobid SR 100 mg one tablet p.o. daily to be started after completion      of the  Ceftin.       SJ/MEDQ  D:  05/30/2005  T:  05/30/2005  Job:  [id number]   cc:   [facility] [place]

## 2011-04-16 NOTE — H&P (Signed)
[name], [name]             ACCOUNT NO.:  [id number]   MEDICAL RECORD NO.:  [id number]          PATIENT TYPE:  INP   LOCATION:  9399                          [facility]ACILITY:  [facility]   PHYSICIAN:  Penni Bombard, MD       DATE OF BIRTH:  1985/12/15   DATE OF ADMISSION:  05/28/2005  DATE OF DISCHARGE:                                HISTORY & PHYSICAL   CHIEF COMPLAINT:  Abdominal pain.   HISTORY OF PRESENT ILLNESS:  This is a 25 year old African-American female  with history of UPJ stenosis and severe pyelo and history of a shock.  The  patient developed fever to 102.7 on Wednesday with nausea and vomiting and  left lower quadrant pain.  The patient had positive urine culture on June 19  but did not fill antibiotic prescription.  The patient has history of a  __________ with septic shock with last episode of pyelo in July 2004.  The  patient presents today with complaint of left lower quadrant abdominal pain  and pain with inspiration.  She has been taking persistent Tylenol, and her  weight down is down approximately 3 pounds.  She denies any dysuria,  frequency, and urgency but has not had these with her other UTIs either.   PAST MEDICAL HISTORY:  1.  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  2.  History of pyelonephritis in July 2004.  3.  History of UPJ stenosis of the left kidney.  4.  She is a G2, P1-0-0-1.   MEDICATIONS:  1.  Macrobid 100 mg SR 1 tab p.o. b.i.d.  2.  Prenatal vitamins daily.   ALLERGIES:  No known drug allergies.   REVIEW OF SYSTEMS:  Positive for a fever and nausea and vomiting.  No  diarrhea.  No chest pain.  No palpitations.  No dysuria, frequency, or  urgency.  No shortness of breath.  No cough.  No skin rashes.  All other  full 7 review of systems is otherwise negative except for those listed in  HPI.   SOCIAL HISTORY:  She lives with her mom, dad, and two siblings and young  son.  She denies tobacco, drugs, or alcohol use.   FAMILY HISTORY:   Noncontributory.   PHYSICAL EXAMINATION:  VITAL SIGNS:  Temp 98.3, blood pressure 97/62, weight  108.7.  Please note this is down 3 pounds from weight approximately a week  ago.  GENERAL APPEARANCE:  Ill-appearing but in no severe apparent distress.  The  patient is alert and oriented x 3.  HEENT:  Eyes are PERRLA and EOMI.  Throat is nonerythematous with no  exudates, but mucous membranes are dry.  LUNGS:  Clear to auscultation bilaterally with no wheezes, crackles, or  rales.  CHEST:  Regular rate and rhythm with no rubs, murmurs, or gallops.  ABDOMEN:  Tender to palpation in the left lower quadrant with no rebound and  no guarding.  EXTREMITIES:  No cyanosis, clubbing, or edema.  And there are 2+ pulses  bilaterally.  There is 2+ deep tendon reflex.  NEUROLOGIC:  Cranial nerves 2-12 are grossly intact,  and gait is within  normal limits.  There are no obvious rashes, and there is no submandibular,  supraclavicular lymphadenopathy.   LABORATORY VALUES:  UA reveals positive nitrite, moderate blood, moderate  LE.  WBCs are too numerous to count.  Epithelial is 5-10, and bacteria is  3+.   ASSESSMENT/PLAN:  1.  Pyelonephritis.  I will check a cath UA and cultures, start ceftriaxone      IV.  Check a renal ultrasound.  Given the fact the patient went into      shock with her last episode of pyelonephritis, I will monitor her      closely.  I will check a CBC and CMET and start IV fluids.  2.  Dehydration.  IV fluids and Phenergan and p.o. diet as tolerated.  3.  Pregnancy.  The patient is 12-1/7 weeks.  We will monitor, and she is      stable.       SJ/MEDQ  D:  05/28/2005  T:  05/28/2005  Job:  [id number]

## 2011-04-16 NOTE — Consult Note (Signed)
[name], [name]                       ACCOUNT NO.:  [id number]   MEDICAL RECORD NO.:  [id number]                   PATIENT TYPE:  INP   LOCATION:  6153                                 FACILITY:  [facility]   PHYSICIAN:  Bertram Millard. Dahlstedt, M.D.          DATE OF BIRTH:  1986/10/02   DATE OF CONSULTATION:  06/22/2003  DATE OF DISCHARGE:                                   CONSULTATION   REASON FOR CONSULTATION:  Kidney infection, hydronephrosis.   BRIEF HISTORY:  This 25 year old female was admitted to the pediatric  service eight days ago. At that time, she had been having a left sided  crampy abdominal pain for a few days. She developed fever and some emesis.  She presented to [facility]. She was admitted with probable  urinary tract infection. Since that time, she has grown out e. Coli which  has been pain sensitive. Her clinical course has been complicated by an  elevated CK, with levels being up to 32,000. This have decreased to 17,000  by today. She has been febrile for quite a few days, but over the past 48  hours has defervesced. She has been treated with broad-spectrum antibiotics.  She is felt to have had possible toxic shock syndrome as well.   Urologic evaluation has included an ultrasound which showed bilateral  hydronephrosis, left greater than right. Additionally, she had debris  medial to her right kidney. She had a CT scan performed last night of her  abdomen and pelvis. This was performed with contrast. This showed a left  hydronephrosis, a right kidney which appeared normal. She had an area of  decreased perfusion and a left kidney consistent with pyelonephritis. I saw  no evidence of infrarenal abscesses. I did not see her left ureter, but her  right ureter was seen was seen throughout the CT scan. Bladder appeared  roughly normal. I did not see any abnormal fluid collection around the right  kidney.   The patient has not been sexually active. She  had no previous symptoms of  left flank pain with diuresis. She denied any prior GI problems.   PHYSICAL EXAMINATION:  GENERAL:  Examination revealed a healthy appearing  thin adolescent female.  ABDOMEN:  She had no CVA tenderness. No flank masses were noted. Abdomen was  scaphoid, soft, nondistended, nontender. There were masses or megaly.   IMPRESSION:  1. Left pyelonephritis without evidence of intrarenal abscess formation.     Clinical course is improving.  2. Left hydronephrosis on CT scan (history of right hydronephrosis on     ultrasound but not present on CT). This may be due to a congenital UPJ     obstruction, or she may have just pyelocaliectasis without evidence of     obstruction. At any point, at this time her clinical course is improving.     I do not think we need to intervene with her kidney at the  present time.  3. History of rhabdomyolysis, improved. She initially had a history of her     creatinine bumping, but this is improved. She has normal creatinine level     at this point.   PLAN:  1. I think we can switch to oral antibiotics at this point, as her clinical     course is improving. It will be worthwhile to watch her for one day     before discharge with her on oral antibiotics only.  2. I would continue her p.o. antibiotics for seven days after she leaves the     hospital.  3. It will be necessary to follow this young lady up at the office; she will     need a VCUG and Lasix renogram to     evaluate her urinary tract.  4. I have left my office number on the patient's instruction sheet. I will     have them give me a call to set up an appointment. I will come back to     see her during this hospitalization if further questions need to be     answered.                                               Bertram Millard. Dahlstedt, M.D.    SMD/MEDQ  D:  06/22/2003  T:  06/23/2003  Job:  [id number]

## 2011-08-31 LAB — POCT PREGNANCY, URINE: Preg Test, Ur: NEGATIVE

## 2011-08-31 LAB — URINE MICROSCOPIC-ADD ON

## 2011-08-31 LAB — URINALYSIS, ROUTINE W REFLEX MICROSCOPIC
Bilirubin Urine: NEGATIVE
Nitrite: NEGATIVE
Specific Gravity, Urine: 1.012
Urobilinogen, UA: 0.2
pH: 6

## 2011-09-01 ENCOUNTER — Encounter: Payer: Self-pay | Admitting: Family Medicine

## 2011-09-01 NOTE — Telephone Encounter (Signed)
Error

## 2011-09-01 NOTE — Telephone Encounter (Signed)
This encounter was created in error - please disregard.

## 2011-09-08 LAB — URINE MICROSCOPIC-ADD ON

## 2011-09-08 LAB — URINE CULTURE: Colony Count: 80000

## 2011-09-08 LAB — URINALYSIS, ROUTINE W REFLEX MICROSCOPIC
Bilirubin Urine: NEGATIVE
Glucose, UA: NEGATIVE
Specific Gravity, Urine: 1.022

## 2011-09-08 LAB — WET PREP, GENITAL: Yeast Wet Prep HPF POC: NONE SEEN

## 2012-03-31 ENCOUNTER — Ambulatory Visit: Payer: Medicaid Other | Admitting: Family Medicine

## 2012-04-09 ENCOUNTER — Encounter (HOSPITAL_COMMUNITY): Payer: Self-pay | Admitting: Emergency Medicine

## 2012-04-09 ENCOUNTER — Emergency Department (HOSPITAL_COMMUNITY)
Admission: EM | Admit: 2012-04-09 | Discharge: 2012-04-10 | Discharge status: Against Medical Advice | Payer: Self-pay | Attending: Emergency Medicine | Admitting: Emergency Medicine

## 2012-04-09 DIAGNOSIS — R109 Unspecified abdominal pain: Secondary | ICD-10-CM | POA: Insufficient documentation

## 2012-04-09 NOTE — ED Notes (Signed)
Pt alert, nad, c/o flank pain, UTI s/s, onset a few days ago, states noticed foul smelling urine several days ago, resp even unlabored, skin pwd

## 2012-04-10 NOTE — ED Notes (Signed)
Pt not in lobby when called

## 2012-04-13 ENCOUNTER — Encounter (HOSPITAL_COMMUNITY): Payer: Self-pay

## 2012-04-13 ENCOUNTER — Emergency Department (HOSPITAL_COMMUNITY)
Admission: EM | Admit: 2012-04-13 | Discharge: 2012-04-13 | Disposition: A | Discharge status: Home / Self Care | Payer: Self-pay | Attending: Emergency Medicine | Admitting: Emergency Medicine

## 2012-04-13 DIAGNOSIS — R109 Unspecified abdominal pain: Secondary | ICD-10-CM | POA: Insufficient documentation

## 2012-04-13 DIAGNOSIS — R112 Nausea with vomiting, unspecified: Secondary | ICD-10-CM | POA: Insufficient documentation

## 2012-04-13 DIAGNOSIS — R61 Generalized hyperhidrosis: Secondary | ICD-10-CM | POA: Insufficient documentation

## 2012-04-13 DIAGNOSIS — N12 Tubulo-interstitial nephritis, not specified as acute or chronic: Secondary | ICD-10-CM | POA: Insufficient documentation

## 2012-04-13 HISTORY — DX: Disorder of kidney and ureter, unspecified: N28.9

## 2012-04-13 LAB — URINALYSIS, ROUTINE W REFLEX MICROSCOPIC
Ketones, ur: 40 mg/dL — AB
Nitrite: NEGATIVE
Protein, ur: 30 mg/dL — AB
pH: 6.5 (ref 5.0–8.0)

## 2012-04-13 LAB — DIFFERENTIAL
Basophils Absolute: 0 10*3/uL (ref 0.0–0.1)
Basophils Relative: 0 % (ref 0–1)
Monocytes Absolute: 1.4 10*3/uL — ABNORMAL HIGH (ref 0.1–1.0)
Neutro Abs: 15.6 10*3/uL — ABNORMAL HIGH (ref 1.7–7.7)
Neutrophils Relative %: 85 % — ABNORMAL HIGH (ref 43–77)

## 2012-04-13 LAB — BASIC METABOLIC PANEL
Chloride: 107 mEq/L (ref 96–112)
Creatinine, Ser: 0.72 mg/dL (ref 0.50–1.10)
GFR calc Af Amer: >90 mL/min (ref >90)
Sodium: 140 mEq/L (ref 135–145)

## 2012-04-13 LAB — CBC
MCHC: 34.2 g/dL (ref 30.0–36.0)
Platelets: 193 10*3/uL (ref 150–400)
RDW: 13.1 % (ref 11.5–15.5)
WBC: 18.3 10*3/uL — ABNORMAL HIGH (ref 4.0–10.5)

## 2012-04-13 LAB — URINE MICROSCOPIC-ADD ON

## 2012-04-13 MED ORDER — CEPHALEXIN 500 MG PO CAPS: 500.0000 mg | ORAL_CAPSULE | Freq: Four times a day (QID) | ORAL | Status: AC

## 2012-04-13 MED ORDER — KETOROLAC TROMETHAMINE 30 MG/ML IJ SOLN
30.0000 mg | Freq: Once | INTRAMUSCULAR | Status: AC
  Administered 2012-04-13: 30 mg via INTRAVENOUS

## 2012-04-13 MED ORDER — OXYCODONE-ACETAMINOPHEN 5-325 MG PO TABS: 1.0000 | ORAL_TABLET | ORAL | Status: AC | PRN

## 2012-04-13 MED ORDER — SODIUM CHLORIDE 0.9 % IV SOLN
INTRAVENOUS | Status: DC
  Administered 2012-04-13: 10:00:00 via INTRAVENOUS

## 2012-04-13 MED ORDER — DIPHENHYDRAMINE HCL 25 MG PO CAPS
25.0000 mg | ORAL_CAPSULE | Freq: Once | ORAL | Status: AC
  Administered 2012-04-13: 25 mg via ORAL
  Filled 2012-04-13: qty 1

## 2012-04-13 MED ORDER — ONDANSETRON HCL 4 MG/2ML IJ SOLN
4.0000 mg | Freq: Once | INTRAMUSCULAR | Status: AC
  Administered 2012-04-13: 4 mg via INTRAVENOUS
  Filled 2012-04-13: qty 2

## 2012-04-13 MED ORDER — KETOROLAC TROMETHAMINE 30 MG/ML IJ SOLN
INTRAMUSCULAR | Status: AC
  Filled 2012-04-13: qty 1

## 2012-04-13 MED ORDER — MORPHINE SULFATE 4 MG/ML IJ SOLN
4.0000 mg | Freq: Once | INTRAMUSCULAR | Status: AC
  Administered 2012-04-13: 4 mg via INTRAVENOUS
  Filled 2012-04-13: qty 1

## 2012-04-13 MED ORDER — DEXTROSE 5 % IV SOLN
1.0000 g | Freq: Once | INTRAVENOUS | Status: AC
  Administered 2012-04-13: 1 g via INTRAVENOUS
  Filled 2012-04-13: qty 10

## 2012-04-13 NOTE — ED Provider Notes (Signed)
History     CSN: [id number]  Arrival date & time 04/13/12  0747   First MD Initiated Contact with Patient 04/13/12 [phone]      Chief Complaint  Patient presents with  . Flank Pain    (Consider location/radiation/quality/duration/timing/severity/associated sxs/prior treatment) HPI Comments: [name] is a 26 y.o. female who presents with left flank pain for one day. She was treated for a UTI with Macrobid 3 days ago. She's had nausea without vomiting. She denies fever. She had a sweating, weight is chills, dizziness, or weakness. She is using Macrobid and ibuprofen, and not improving. His history of pyelonephritis, but no history of kidney stone.  Patient is a 26 y.o. female presenting with flank pain. The history is provided by the patient.  Flank Pain    Past Medical History  Diagnosis Date  . Renal disorder     hx of pyelonephritis    Past Surgical History  Procedure Date  . Mirena 2007    No family history on file.  History  Substance Use Topics  . Smoking status: Never Smoker   . Smokeless tobacco: Not on file  . Alcohol Use: No    OB History    Grav Para Term Preterm Abortions TAB SAB Ect Mult Living                  Review of Systems  Genitourinary: Positive for flank pain.  All other systems reviewed and are negative.    Allergies  Review of patient's allergies indicates no known allergies.  Home Medications   Current Outpatient Rx  Name Route Sig Dispense Refill  . IBUPROFEN 200 MG PO TABS Oral Take 600 mg by mouth every 6 (six) hours as needed. For pain    . LEVONORGESTREL 20 MCG/24HR IU IUD Intrauterine 1 each by Intrauterine route once. 1 each 0  . CEPHALEXIN 500 MG PO CAPS Oral Take 1 capsule (500 mg total) by mouth 4 (four) times daily. 28 capsule 0  . OXYCODONE-ACETAMINOPHEN 5-325 MG PO TABS Oral Take 1 tablet by mouth every 4 (four) hours as needed for pain. 20 tablet 0    BP 117/71  Pulse 93  Temp(Src) 98.5 F (36.9 C) (Oral)   Resp 16  Ht 5\' 4"  (1.626 m)  Wt 125 lb (56.7 kg)  BMI 21.46 kg/m2  SpO2 99%  Physical Exam  Nursing note and vitals reviewed. Constitutional: She is oriented to person, place, and time. She appears well-developed and well-nourished. No distress.  HENT:  Head: Normocephalic and atraumatic.  Eyes: Conjunctivae and EOM are normal. Pupils are equal, round, and reactive to light.  Neck: Normal range of motion and phonation normal. Neck supple.  Cardiovascular: Normal rate, regular rhythm and intact distal pulses.   Pulmonary/Chest: Effort normal and breath sounds normal. She exhibits no tenderness.  Abdominal: Soft. She exhibits no distension. There is tenderness (Mild left upper). There is no guarding.  Genitourinary:       Positive left costovertebral angle tenderness  Musculoskeletal: Normal range of motion.  Neurological: She is alert and oriented to person, place, and time. She has normal strength. She exhibits normal muscle tone.  Skin: Skin is warm and dry.  Psychiatric: She has a normal mood and affect. Her behavior is normal. Judgment and thought content normal.    ED Course  Procedures (including critical care time)   Emergency department treatment: IV fluids, IV, Rocephin, IV, Toradol, IV, morphine, and Zofran.  Reevaluation, 12:15- patient feels better  and is comfortable with discharge at this time.   Labs Reviewed  CBC - Abnormal; Notable for the following:    WBC 18.3 (*)    All other components within normal limits  DIFFERENTIAL - Abnormal; Notable for the following:    Neutrophils Relative 85 (*)    Neutro Abs 15.6 (*)    Lymphocytes Relative 4 (*)    Monocytes Absolute 1.4 (*)    All other components within normal limits  URINALYSIS, ROUTINE W REFLEX MICROSCOPIC - Abnormal; Notable for the following:    APPearance CLOUDY (*)    Hgb urine dipstick LARGE (*)    Ketones, ur 40 (*)    Protein, ur 30 (*)    Leukocytes, UA LARGE (*)    All other components  within normal limits  URINE MICROSCOPIC-ADD ON - Abnormal; Notable for the following:    Squamous Epithelial / LPF FEW (*)    Bacteria, UA FEW (*)    All other components within normal limits  BASIC METABOLIC PANEL  URINE CULTURE   No results found.   1. Pyelonephritis       MDM  Uncomplicated pyelonephritis. Doubt sepsis, metabolic instability or impending vascular collapse.. She is stable for discharge with outpatient management.   Plan: Home Medications- Keflex, Percocet; Home Treatments- fluids, rest; Recommended follow up- PCP of choice prn        Flint Melter, MD 04/13/12 1220

## 2012-04-13 NOTE — ED Notes (Signed)
Dr. Effie Shy made aware pt's labs are resulted.

## 2012-04-13 NOTE — ED Notes (Signed)
MD at bedside. 

## 2012-04-13 NOTE — ED Notes (Signed)
Pt sts she was seen at a clinic three days ago and was told she had a UTI and gave her Macrobid. She sts the pain is not any better and has been taking antibiotic as prescribed.

## 2012-04-13 NOTE — ED Notes (Signed)
Patient stated itching left side of upper face.  Skin light pink trace EDP notified.  Airway intact bilateral equal chest rise and fall lung sounds clear in all fields.

## 2012-04-13 NOTE — ED Notes (Signed)
Lab at bedside

## 2012-04-13 NOTE — Discharge Instructions (Signed)
Drink a lot of fluids. Stop taking the Macrobid. Take ibuprofen every 8 hours as needed for fever. See the Dr. of your choice if not better in 2 or 3 days.  Pyelonephritis, Adult Pyelonephritis is a kidney infection. A kidney infection can happen quickly, or it can last for a long time. HOME CARE   Take your medicine (antibiotics) as told. Finish it even if you start to feel better.   Keep all doctor visits as told.   Drink enough fluids to keep your pee (urine) clear or pale yellow.   Only take medicine as told by your doctor.  GET HELP RIGHT AWAY IF:   You have a fever.   You cannot take your medicine or drink fluids as told.   You have chills and shaking.   You feel very weak or pass out (faint).   You do not feel better after 2 days.  MAKE SURE YOU:  Understand these instructions.   Will watch your condition.   Will get help right away if you are not doing well or get worse.  Document Released: 12/23/2004 Document Revised: 11/04/2011 Document Reviewed: 05/05/2011 [facility] Patient Information 2012 [place], [place].

## 2012-04-14 LAB — URINE CULTURE: Culture  Setup Time: 201305160925

## 2014-06-11 ENCOUNTER — Ambulatory Visit: Payer: Medicaid Other | Admitting: Family Medicine

## 2015-07-01 ENCOUNTER — Ambulatory Visit: Payer: Medicaid Other | Admitting: Family Medicine

## 2015-09-16 ENCOUNTER — Ambulatory Visit: Payer: Medicaid Other | Admitting: Family Medicine

## 2015-12-31 ENCOUNTER — Encounter (HOSPITAL_COMMUNITY): Payer: Self-pay | Admitting: Emergency Medicine

## 2015-12-31 ENCOUNTER — Emergency Department (HOSPITAL_COMMUNITY)
Admission: EM | Admit: 2015-12-31 | Discharge: 2015-12-31 | Disposition: A | Discharge status: Home / Self Care | Payer: Medicaid Other | Attending: Emergency Medicine | Admitting: Emergency Medicine

## 2015-12-31 DIAGNOSIS — R519 Headache, unspecified: Secondary | ICD-10-CM

## 2015-12-31 DIAGNOSIS — R51 Headache: Secondary | ICD-10-CM | POA: Insufficient documentation

## 2015-12-31 DIAGNOSIS — Z87448 Personal history of other diseases of urinary system: Secondary | ICD-10-CM | POA: Insufficient documentation

## 2015-12-31 MED ORDER — KETOROLAC TROMETHAMINE 60 MG/2ML IM SOLN
30.0000 mg | Freq: Once | INTRAMUSCULAR | Status: AC
  Administered 2015-12-31: 30 mg via INTRAMUSCULAR
  Filled 2015-12-31: qty 2

## 2015-12-31 MED ORDER — METOCLOPRAMIDE HCL 10 MG PO TABS
10.0000 mg | ORAL_TABLET | Freq: Once | ORAL | Status: AC
  Administered 2015-12-31: 10 mg via ORAL
  Filled 2015-12-31: qty 1

## 2015-12-31 MED ORDER — DIPHENHYDRAMINE HCL 25 MG PO CAPS
50.0000 mg | ORAL_CAPSULE | Freq: Once | ORAL | Status: AC
  Administered 2015-12-31: 50 mg via ORAL
  Filled 2015-12-31: qty 2

## 2015-12-31 NOTE — ED Notes (Addendum)
Pt reports headache worse right side , also reports right earache, sts has been taking ibuprofen x 1 week with no relief. Denies vision changes nor light sensitivity. Also denies nausea nor dizziness.

## 2015-12-31 NOTE — ED Provider Notes (Signed)
CSN: [id number]     Arrival date & time 12/31/15  1149 History  By signing my name below, I, Tanda Rockers, attest that this documentation has been prepared under the direction and in the presence of [employer], PA-C. Electronically Signed: Tanda Rockers, ED Scribe. 12/31/2015. 1:06 PM.   Chief Complaint  Patient presents with  . Headache   The history is provided by the patient. No language interpreter was used.     HPI Comments: [name] is a 30 y.o. female who presents to the Emergency Department complaining of gradual onset, constant, moderate, throbbing, right sided headache x 1 week. Pt reports that the headache seems to be worse at night time. Pt states she had right ear pain last night that has since resolved on its own. She has been taking Ibuprofen with some relief. Pt mentions having a similar headache about 3 months ago and was given a headache cocktail in the ED in [place], [place] which alleviated her symptoms. Denies numbness, weakness, visual changes, nausea, vomiting, photophobia, noise sensitivity, or any other associated symptoms. Pt does not have menses due to being on Mirena.   Past Medical History  Diagnosis Date  . Renal disorder     hx of pyelonephritis   Past Surgical History  Procedure Laterality Date  . Mirena  2007   No family history on file. Social History  Substance Use Topics  . Smoking status: Never Smoker   . Smokeless tobacco: None  . Alcohol Use: No   OB History    No data available     Review of Systems  A complete 10 system review of systems was obtained and all systems are negative except as noted in the HPI and PMH.   Allergies  Review of patient's allergies indicates no known allergies.  Home Medications   Prior to Admission medications   Medication Sig Start Date End Date Taking? Authorizing Provider  ibuprofen (ADVIL,MOTRIN) 200 MG tablet Take 600 mg by mouth every 6 (six) hours as needed. For pain    Historical  Provider, MD  levonorgestrel (MIRENA) 20 MCG/24HR IUD 1 each by Intrauterine route once. 03/03/11   Macy Mis, MD   BP 120/62 mmHg  Pulse 74  Temp(Src) 97.6 F (36.4 C) (Oral)  Resp 20  SpO2 100%   Physical Exam  Constitutional: She is oriented to person, place, and time. She appears well-developed and well-nourished. No distress.  HENT:  Head: Normocephalic and atraumatic.  Mouth/Throat: Oropharynx is clear and moist.  Eyes: Conjunctivae and EOM are normal. Pupils are equal, round, and reactive to light.  No nystagmus  Neck: Normal range of motion. Neck supple. No tracheal deviation present.  No meningismus or nuchal rigidity  Cardiovascular: Normal rate, regular rhythm and normal heart sounds.   Pulmonary/Chest: Effort normal and breath sounds normal. No respiratory distress. She has no wheezes. She has no rales.  Abdominal: Soft. There is no tenderness.  Musculoskeletal: Normal range of motion. She exhibits no edema or tenderness.  Neurological: She is alert and oriented to person, place, and time.  Gait baseline No ataxia Finger to nose without difficulty Motor strength and sensation baseline  Skin: Skin is warm and dry. She is not diaphoretic.  Psychiatric: She has a normal mood and affect. Her behavior is normal.  Nursing note and vitals reviewed.   ED Course  Procedures (including critical care time)  DIAGNOSTIC STUDIES: Oxygen Saturation is 100% on RA, normal by my interpretation.    COORDINATION  OF CARE: 1:06 PM-Discussed treatment plan which includes Toradol, Reglan, and Benadryl with pt at bedside and pt agreed to plan.   Labs Review Labs Reviewed - No data to display  Imaging Review No results found.    EKG Interpretation None      Meds given in ED:  Medications  ketorolac (TORADOL) injection 30 mg (30 mg Intramuscular Given 12/31/15 1334)  metoCLOPramide (REGLAN) tablet 10 mg (10 mg Oral Given 12/31/15 1333)  diphenhydrAMINE (BENADRYL) capsule 50  mg (50 mg Oral Given 12/31/15 1333)    Discharge Medication List as of 12/31/2015  1:49 PM      Filed Vitals:   12/31/15 1153 12/31/15 1411  BP: 131/80 120/62  Pulse: 83 74  Temp: 97.6 F (36.4 C)   Resp: 20     MDM  Pt HA treated and improved while in ED.  Presentation is like pts typical HA and non concerning for [facility], ICH, Meningitis, or temporal arteritis. Pt is afebrile with no focal neuro deficits, nuchal rigidity, or change in vision. Pt is to follow up with PCP to discuss prophylactic medication. Pt verbalizes understanding and is agreeable with plan to dc.   Final diagnoses:  Nonintractable headache, unspecified chronicity pattern, unspecified headache type    I personally performed the services described in this documentation, which was scribed in my presence. The recorded information has been reviewed and is accurate.     Joycie Peek, PA-C 12/31/15 1528  Mancel Bale, MD 12/31/15 [phone]

## 2015-12-31 NOTE — Discharge Instructions (Signed)
Follow-up with your doctor as needed for reevaluation. Return to ED for any new or worsening symptoms.  General Headache Without Cause A headache is pain or discomfort felt around the head or neck area. The specific cause of a headache may not be found. There are many causes and types of headaches. A few common ones are:  Tension headaches.  Migraine headaches.  Cluster headaches.  Chronic daily headaches. HOME CARE INSTRUCTIONS  Watch your condition for any changes. Take these steps to help with your condition: Managing Pain  Take over-the-counter and prescription medicines only as told by your health care provider.  Lie down in a dark, quiet room when you have a headache.  If directed, apply ice to the head and neck area:  Put ice in a plastic bag.  Place a towel between your skin and the bag.  Leave the ice on for 20 minutes, 2-3 times per day.  Use a heating pad or hot shower to apply heat to the head and neck area as told by your health care provider.  Keep lights dim if bright lights bother you or make your headaches worse. Eating and Drinking  Eat meals on a regular schedule.  Limit alcohol use.  Decrease the amount of caffeine you drink, or stop drinking caffeine. General Instructions  Keep all follow-up visits as told by your health care provider. This is important.  Keep a headache journal to help find out what may trigger your headaches. For example, write down:  What you eat and drink.  How much sleep you get.  Any change to your diet or medicines.  Try massage or other relaxation techniques.  Limit stress.  Sit up straight, and do not tense your muscles.  Do not use tobacco products, including cigarettes, chewing tobacco, or e-cigarettes. If you need help quitting, ask your health care provider.  Exercise regularly as told by your health care provider.  Sleep on a regular schedule. Get 7-9 hours of sleep, or the amount recommended by your  health care provider. SEEK MEDICAL CARE IF:   Your symptoms are not helped by medicine.  You have a headache that is different from the usual headache.  You have nausea or you vomit.  You have a fever. SEEK IMMEDIATE MEDICAL CARE IF:   Your headache becomes severe.  You have repeated vomiting.  You have a stiff neck.  You have a loss of vision.  You have problems with speech.  You have pain in the eye or ear.  You have muscular weakness or loss of muscle control.  You lose your balance or have trouble walking.  You feel faint or pass out.  You have confusion.   This information is not intended to replace advice given to you by your health care provider. Make sure you discuss any questions you have with your health care provider.   Document Released: 11/15/2005 Document Revised: 08/06/2015 Document Reviewed: 03/10/2015 Elsevier Interactive Patient Education [employer]

## 2016-10-04 ENCOUNTER — Encounter (HOSPITAL_COMMUNITY): Payer: Self-pay | Admitting: Family Medicine

## 2016-10-04 DIAGNOSIS — R109 Unspecified abdominal pain: Secondary | ICD-10-CM | POA: Insufficient documentation

## 2016-10-04 DIAGNOSIS — Z5321 Procedure and treatment not carried out due to patient leaving prior to being seen by health care provider: Secondary | ICD-10-CM | POA: Insufficient documentation

## 2016-10-04 LAB — POC URINE PREG, ED: Preg Test, Ur: NEGATIVE

## 2016-10-04 NOTE — ED Triage Notes (Signed)
Patient reports she is experiencing left flank pain that started today. Also, has dark urine, increase in frequency and urgency. Denies blood in urine. Took IBUPROFEN and AZO with no relief.

## 2016-10-05 ENCOUNTER — Emergency Department (HOSPITAL_COMMUNITY)
Admission: EM | Admit: 2016-10-05 | Discharge: 2016-10-05 | Disposition: A | Discharge status: Home / Self Care | Payer: Medicaid Other | Attending: Emergency Medicine | Admitting: Emergency Medicine

## 2016-10-05 ENCOUNTER — Encounter (HOSPITAL_COMMUNITY): Payer: Self-pay | Admitting: *Deleted

## 2016-10-05 DIAGNOSIS — Z791 Long term (current) use of non-steroidal anti-inflammatories (NSAID): Secondary | ICD-10-CM | POA: Insufficient documentation

## 2016-10-05 DIAGNOSIS — N390 Urinary tract infection, site not specified: Secondary | ICD-10-CM | POA: Insufficient documentation

## 2016-10-05 DIAGNOSIS — Z79899 Other long term (current) drug therapy: Secondary | ICD-10-CM | POA: Insufficient documentation

## 2016-10-05 LAB — URINALYSIS, ROUTINE W REFLEX MICROSCOPIC
GLUCOSE, UA: NEGATIVE mg/dL
KETONES UR: NEGATIVE mg/dL
Nitrite: POSITIVE — AB
PH: 6.5 (ref 5.0–8.0)
Protein, ur: 30 mg/dL — AB
Specific Gravity, Urine: 1.01 (ref 1.005–1.030)

## 2016-10-05 LAB — URINE MICROSCOPIC-ADD ON

## 2016-10-05 MED ORDER — NITROFURANTOIN MONOHYD MACRO 100 MG PO CAPS: 100.0000 mg | ORAL_CAPSULE | Freq: Two times a day (BID) | ORAL | 0 refills | Status: AC

## 2016-10-05 MED ORDER — PHENAZOPYRIDINE HCL 200 MG PO TABS: 200.0000 mg | ORAL_TABLET | Freq: Three times a day (TID) | ORAL | 0 refills | Status: AC

## 2016-10-05 MED ORDER — NITROFURANTOIN MONOHYD MACRO 100 MG PO CAPS
100.0000 mg | ORAL_CAPSULE | Freq: Once | ORAL | Status: AC
  Administered 2016-10-05: 100 mg via ORAL
  Filled 2016-10-05: qty 1

## 2016-10-05 NOTE — ED Provider Notes (Signed)
[facility]-EMERGENCY DEPT Provider Note   CSN: [id number] Arrival date & time: 10/05/16  1815     History   Chief Complaint Chief Complaint  Patient presents with  . Flank Pain    HPI [name] is a 30 y.o. female.  Left flank pain, malodorous urine, urinary frequency for 2 days. Patient was seen last night and urinalysis obtained. She had to work and left against medical advice. No fever, sweats, chills.  She is ambulatory and drinking fluids. Past medical history of urinary tract infections.      Past Medical History:  Diagnosis Date  . Renal disorder    hx of pyelonephritis    Patient Active Problem List   Diagnosis Date Noted  . Contraceptive device, intrauterine 01/20/2011    Past Surgical History:  Procedure Laterality Date  . mirena  2007    OB History    No data available       Home Medications    Prior to Admission medications   Medication Sig Start Date End Date Taking? Authorizing Provider  ibuprofen (ADVIL,MOTRIN) 200 MG tablet Take 600 mg by mouth every 6 (six) hours as needed for headache, mild pain or moderate pain.    Yes Historical Provider, MD  levonorgestrel (MIRENA) 20 MCG/24HR IUD 1 each by Intrauterine route once.   Yes Macy MisKim K Briscoe, MD  nitrofurantoin, macrocrystal-monohydrate, (MACROBID) 100 MG capsule Take 1 capsule (100 mg total) by mouth 2 (two) times daily. 10/05/16   Donnetta HutchingBrian Kingston Guiles, MD  phenazopyridine (PYRIDIUM) 200 MG tablet Take 1 tablet (200 mg total) by mouth 3 (three) times daily. 10/05/16   Donnetta HutchingBrian Avrie Kedzierski, MD    Family History No family history on file.  Social History Social History  Substance Use Topics  . Smoking status: Never Smoker  . Smokeless tobacco: Never Used  . Alcohol use No     Allergies   Patient has no known allergies.   Review of Systems Review of Systems  All other systems reviewed and are negative.    Physical Exam Updated Vital Signs BP 127/77 (BP Location: Left Arm)   Pulse 75   Temp  98.1 F (36.7 C) (Oral)   Resp 18   Ht 5\' 4"  (1.626 m)   Wt 127 lb 1 oz (57.6 kg)   SpO2 99%   BMI 21.81 kg/m   Physical Exam  Constitutional: She is oriented to person, place, and time. She appears well-developed and well-nourished.  Nontoxic-appearing, well-hydrated  HENT:  Head: Normocephalic and atraumatic.  Eyes: Conjunctivae are normal.  Neck: Neck supple.  Cardiovascular: Normal rate and regular rhythm.   Pulmonary/Chest: Effort normal and breath sounds normal.  Abdominal: Soft. Bowel sounds are normal.  Genitourinary:  Genitourinary Comments: Minimal left flank tenderness.  Musculoskeletal: Normal range of motion.  Neurological: She is alert and oriented to person, place, and time.  Skin: Skin is warm and dry.  Psychiatric: She has a normal mood and affect. Her behavior is normal.  Nursing note and vitals reviewed.    ED Treatments / Results  Labs (all labs ordered are listed, but only abnormal results are displayed) Labs Reviewed - No data to display  EKG  EKG Interpretation None       Radiology No results found.  Procedures Procedures (including critical care time)  Medications Ordered in ED Medications  nitrofurantoin (macrocrystal-monohydrate) (MACROBID) capsule 100 mg (100 mg Oral Given 10/05/16 2039)     Initial Impression / Assessment and Plan / ED Course  I  have reviewed the triage vital signs and the nursing notes.  Pertinent labs & imaging results that were available during my care of the patient were reviewed by me and considered in my medical decision making (see chart for details).  Clinical Course     Urinalysis reviewed from last night. Patient states Macrobid has worked in the past. Although she has flank pain, there are no complaints of fever or chills. Will treat as outpatient.  Final Clinical Impressions(s) / ED Diagnoses   Final diagnoses:  Urinary tract infection without hematuria, site unspecified    New  Prescriptions New Prescriptions   NITROFURANTOIN, MACROCRYSTAL-MONOHYDRATE, (MACROBID) 100 MG CAPSULE    Take 1 capsule (100 mg total) by mouth 2 (two) times daily.   PHENAZOPYRIDINE (PYRIDIUM) 200 MG TABLET    Take 1 tablet (200 mg total) by mouth 3 (three) times daily.     Donnetta HutchingBrian Mayola Mcbain, MD 10/05/16 2104

## 2016-10-05 NOTE — ED Triage Notes (Signed)
Pt called from triage, no answer 

## 2016-10-05 NOTE — Discharge Instructions (Signed)
Increase fluids.  Prescription for antibiotic

## 2016-10-05 NOTE — ED Triage Notes (Signed)
Pt complains of left flank pain malodorous urine since yesterday. Pt took AZO yesterday and had urinalysis performed in ED but left before being seen.

## 2017-05-10 ENCOUNTER — Ambulatory Visit: Payer: Self-pay | Admitting: Allergy and Immunology

## 2017-06-29 ENCOUNTER — Ambulatory Visit: Payer: Self-pay | Admitting: Allergy

## 2017-10-04 ENCOUNTER — Ambulatory Visit: Payer: Self-pay | Admitting: Allergy and Immunology

## 2019-03-11 ENCOUNTER — Other Ambulatory Visit: Payer: Self-pay

## 2019-03-11 ENCOUNTER — Encounter (HOSPITAL_COMMUNITY): Payer: Self-pay | Admitting: Emergency Medicine

## 2019-03-11 ENCOUNTER — Emergency Department (HOSPITAL_COMMUNITY)
Admission: EM | Admit: 2019-03-11 | Discharge: 2019-03-11 | Disposition: A | Discharge status: Home / Self Care | Payer: Self-pay | Attending: Emergency Medicine | Admitting: Emergency Medicine

## 2019-03-11 DIAGNOSIS — T7840XA Allergy, unspecified, initial encounter: Secondary | ICD-10-CM | POA: Insufficient documentation

## 2019-03-11 MED ORDER — FAMOTIDINE 20 MG PO TABS: 20.0000 mg | ORAL_TABLET | Freq: Two times a day (BID) | ORAL | 0 refills | Status: AC

## 2019-03-11 MED ORDER — DIPHENHYDRAMINE HCL 25 MG PO TABS: 25.0000 mg | ORAL_TABLET | Freq: Three times a day (TID) | ORAL | 0 refills | Status: AC | PRN

## 2019-03-11 MED ORDER — PREDNISONE 20 MG PO TABS
60.0000 mg | ORAL_TABLET | Freq: Once | ORAL | Status: AC
  Administered 2019-03-11: 06:00:00 60 mg via ORAL
  Filled 2019-03-11: qty 3

## 2019-03-11 MED ORDER — DIPHENHYDRAMINE HCL 25 MG PO CAPS
25.0000 mg | ORAL_CAPSULE | Freq: Once | ORAL | Status: AC
  Administered 2019-03-11: 25 mg via ORAL
  Filled 2019-03-11: qty 1

## 2019-03-11 MED ORDER — PREDNISONE 20 MG PO TABS: 60.0000 mg | ORAL_TABLET | Freq: Every day | ORAL | 0 refills | Status: AC

## 2019-03-11 MED ORDER — FAMOTIDINE 20 MG PO TABS
20.0000 mg | ORAL_TABLET | Freq: Once | ORAL | Status: AC
  Administered 2019-03-11: 06:00:00 20 mg via ORAL
  Filled 2019-03-11: qty 1

## 2019-03-11 MED ORDER — EPINEPHRINE 0.3 MG/0.3ML IJ SOAJ: 0.3000 mg | INTRAMUSCULAR | 0 refills | Status: AC | PRN

## 2019-03-11 NOTE — ED Provider Notes (Signed)
[facility]-EMERGENCY DEPT Provider Note   CSN: [id number] Arrival date & time: 03/11/19  0557    History   Chief Complaint Chief Complaint  Patient presents with  . Facial Swelling  . Rash    HPI [name] is a 34 y.o. female.     HPI  This is a 33 year old female who presents with a possible allergic reaction.  Patient reports that she ate fish last night for dinner.  After eating fish she noted swelling of her eyes.  She subsequently developed rash that is itchy on her bilateral upper extremities and her back.  She denies any shortness of breath, throat swelling, difficulty swallowing, nausea, vomiting.  No known allergies.  Denies any new medications or other possible eliciting factors.  She did take Benadryl with no significant relief.  Past Medical History:  Diagnosis Date  . Renal disorder    hx of pyelonephritis    Patient Active Problem List   Diagnosis Date Noted  . Contraceptive device, intrauterine 01/20/2011    Past Surgical History:  Procedure Laterality Date  . mirena  2007     OB History   No obstetric history on file.      Home Medications    Prior to Admission medications   Medication Sig Start Date End Date Taking? Authorizing Provider  diphenhydrAMINE (BENADRYL) 25 MG tablet Take 1 tablet (25 mg total) by mouth every 8 (eight) hours as needed. 03/11/19   Ciela Mahajan, Mayer Masker, MD  EPINEPHrine 0.3 mg/0.3 mL IJ SOAJ injection Inject 0.3 mLs (0.3 mg total) into the muscle as needed for anaphylaxis. 03/11/19   Tamiko Leopard, Mayer Masker, MD  famotidine (PEPCID) 20 MG tablet Take 1 tablet (20 mg total) by mouth 2 (two) times daily. 03/11/19   Kimba Lottes, Mayer Masker, MD  ibuprofen (ADVIL,MOTRIN) 200 MG tablet Take 600 mg by mouth every 6 (six) hours as needed for headache, mild pain or moderate pain.     [provider]  levonorgestrel (MIRENA) 20 MCG/24HR IUD 1 each by Intrauterine route once.    Macy Mis, MD   nitrofurantoin, macrocrystal-monohydrate, (MACROBID) 100 MG capsule Take 1 capsule (100 mg total) by mouth 2 (two) times daily. 10/05/16   Donnetta Hutching, MD  phenazopyridine (PYRIDIUM) 200 MG tablet Take 1 tablet (200 mg total) by mouth 3 (three) times daily. 10/05/16   Donnetta Hutching, MD  predniSONE (DELTASONE) 20 MG tablet Take 3 tablets (60 mg total) by mouth daily. 03/11/19   Ahyana Skillin, Mayer Masker, MD    Family History No family history on file.  Social History Social History   Tobacco Use  . Smoking status: Never Smoker  . Smokeless tobacco: Never Used  Substance Use Topics  . Alcohol use: No  . Drug use: No     Allergies   Patient has no known allergies.   Review of Systems Review of Systems  HENT: Positive for facial swelling. Negative for trouble swallowing.   Respiratory: Negative for shortness of breath.   Cardiovascular: Negative for chest pain.  Gastrointestinal: Negative for abdominal pain, nausea and vomiting.  Genitourinary: Negative for dysuria.  Skin: Positive for rash.  All other systems reviewed and are negative.    Physical Exam Updated Vital Signs BP 126/86   Pulse 88   Temp 98.2 F (36.8 C)   Resp 15   Ht 1.626 m ( )   Wt 57.6 kg   SpO2 100%   BMI 21.80 kg/m   Physical Exam Vitals  signs and nursing note reviewed.  Constitutional:      General: She is not in acute distress.    Appearance: Normal appearance. She is well-developed. She is not ill-appearing.  HENT:     Head: Normocephalic and atraumatic.     Ears:     Comments: Swelling and puffiness noted about bilateral eyes    Mouth/Throat:     Mouth: Mucous membranes are moist.     Comments: Posterior oropharynx visible, no tongue swelling noted Eyes:     Pupils: Pupils are equal, round, and reactive to light.  Neck:     Musculoskeletal: Neck supple.  Cardiovascular:     Rate and Rhythm: Normal rate and regular rhythm.  Pulmonary:     Effort: Pulmonary effort is normal. No  respiratory distress.  Abdominal:     Palpations: Abdomen is soft.     Tenderness: There is no abdominal tenderness.  Musculoskeletal:        General: No swelling.  Skin:    General: Skin is warm and dry.     Comments: Blanching erythematous rash bilateral upper extremities and back  Neurological:     Mental Status: She is alert and oriented to person, place, and time.  Psychiatric:        Mood and Affect: Mood normal.      ED Treatments / Results  Labs (all labs ordered are listed, but only abnormal results are displayed) Labs Reviewed - No data to display  EKG None  Radiology No results found.  Procedures Procedures (including critical care time)  Medications Ordered in ED Medications  diphenhydrAMINE (BENADRYL) capsule 25 mg (25 mg Oral Given 03/11/19 0618)  famotidine (PEPCID) tablet 20 mg (20 mg Oral Given 03/11/19 0618)  predniSONE (DELTASONE) tablet 60 mg (60 mg Oral Given 03/11/19 [id number])     Initial Impression / Assessment and Plan / ED Course  I have reviewed the triage vital signs and the nursing notes.  Pertinent labs & imaging results that were available during my care of the patient were reviewed by me and considered in my medical decision making (see chart for details).        Patient presents with likely allergic reaction.  She is overall nontoxic-appearing and vital signs are notable for mild tachycardia.  She has been having this reaction for approximately 12 hours.  No signs or symptoms at this time of anaphylaxis.  She does appear to be having some sort of histamine response or allergic reaction.  Patient was given Benadryl, Pepcid, prednisone.  Will plan for discharge home with the same in addition to an EpiPen.  Patient is agreeable to plan.  After history, exam, and medical workup I feel the patient has been appropriately medically screened and is safe for discharge home. Pertinent diagnoses were discussed with the patient. Patient was given return  precautions.   Final Clinical Impressions(s) / ED Diagnoses   Final diagnoses:  Allergic reaction, initial encounter    ED Discharge Orders         Ordered    famotidine (PEPCID) 20 MG tablet  2 times daily     03/11/19 0638    diphenhydrAMINE (BENADRYL) 25 MG tablet  Every 8 hours PRN     03/11/19 0638    predniSONE (DELTASONE) 20 MG tablet  Daily     03/11/19 0638    EPINEPHrine 0.3 mg/0.3 mL IJ SOAJ injection  As needed     03/11/19 [id number]  Shon Baton, MD 03/11/19 0700

## 2019-03-11 NOTE — Discharge Instructions (Addendum)
You were seen today and likely had an allergic reaction to what you ate last night.  Avoid the allergen in the future.  Take Pepcid, Benadryl, and prednisone for the next 4 days.  You will be given a prescription for an EpiPen.  If you develop shortness of breath, nausea, vomiting, throat swelling, this may be evidence of anaphylaxis and you should use the EpiPen.

## 2019-03-11 NOTE — ED Triage Notes (Addendum)
Patient here from home with complaints of facial swelling after eating fish from [facility]. Denies allergies, only seasonal. Denies trouble swallowing. Breathing with no difficulties. Benadryl yesterday with no relief. Also reports itchy rash on back.

## 2021-01-29 NOTE — Progress Notes (Signed)
    SUBJECTIVE:   CHIEF COMPLAINT / HPI: contraception   Past medical history Patient reports kidney infection as a teenager age [age].  She reports having to take Macrobid during those flares.  It has been 2 years since she has needed Macrobid.  Patient has reported normal urination without hematuria. She has no dysuria. She denies any vaginal discharge.   Current medications Claritin and Flonase for allergic rhinitis  Family history Maternal HTN   OB/GYN history Current contraception: yes, Mirena 5 years ago  Sexually active; yes with spouse Pregnancy history: 2 sons, 73 and 44  Patient will like to have gonorrhea chlamydia screening as well as hepatitis C and HIV screening. She denies any abnormal vaginal bleeding or vaginal discharge/odor. Patient reports that her last menstrual cycle was during the first year have Mirena.  She has not had menses since.   HM  Patient recommended for Hep C screening, pap smear and influenza vaccine. She declines influenza vaccine. She is agreeable to pap smear today and tetanus vaccine. Will not order labs due to insurance coverage issue.   PERTINENT  PMH / PSH:  Allergic rhinitis  Hx of Pyelonephritis, 2013 Non smoker Married with 2 sons   OBJECTIVE:   BP 110/70   Pulse 100   Ht 5\' 4"  (1.626 m)   Wt 144 lb 9.6 oz (65.6 kg)   SpO2 99%   BMI 24.82 kg/m   General: female appearing stated age in no acute distress HEENT: MMM, no oral lesions noted,Neck non-tender without lymphadenopathy, masses or thyromegaly Cardio: Normal S1 and S2, no S3 or S4. Rhythm is regular. No murmurs or rubs.  Bilateral radial pulses palpable Pulm: Clear to auscultation bilaterally, no crackles, wheezing, or diminished breath sounds. Normal respiratory effort, stable on RA Abdomen: Bowel sounds normal. Abdomen soft and non-tender.  Extremities: No peripheral edema. Warm/ well perfused.  Neuro: pt alert and oriented x4   Genitalia:  Normal introitus for  age, no external lesions, white vaginal discharge pooled in vaginal vault, mucosa pink and moist, no vaginal or cervical lesions, no vaginal atrophy, no friaility or hemorrhage, normal uterus size and position, no adnexal masses or tenderness   ASSESSMENT/PLAN:   Healthcare maintenance Completed pap smear, will f/u with results once available  Declines influenza vaccine  Patient agreeable to Hep C and HIV screening however listed as family planning insurance so will hold off until insurance plan is clarified  Contraceptive device, intrauterine Patient reports having mirena placed in 2017 and would like to have it replaced as she has reached the five year mark. She has been amenorrheic for three years.  Informed patient of approval for 7 years with mirena  Patient would like to replace it in 2 years    Allergic rhinitis Controlled on flonase and claritin  Provided prescription for above      2018, MD [facility]

## 2021-01-30 ENCOUNTER — Other Ambulatory Visit: Payer: Self-pay

## 2021-01-30 ENCOUNTER — Other Ambulatory Visit (HOSPITAL_COMMUNITY)
Admission: RE | Admit: 2021-01-30 | Discharge: 2021-01-30 | Disposition: A | Discharge status: Home / Self Care | Payer: Medicaid Other | Source: Ambulatory Visit | Attending: Family Medicine | Admitting: Family Medicine

## 2021-01-30 ENCOUNTER — Encounter: Payer: Self-pay | Admitting: Family Medicine

## 2021-01-30 ENCOUNTER — Ambulatory Visit (INDEPENDENT_AMBULATORY_CARE_PROVIDER_SITE_OTHER): Payer: Medicaid Other | Admitting: Family Medicine

## 2021-01-30 VITALS — BP 110/70 | HR 100 | Ht 64.0 in | Wt 144.6 lb

## 2021-01-30 DIAGNOSIS — Z975 Presence of (intrauterine) contraceptive device: Secondary | ICD-10-CM

## 2021-01-30 DIAGNOSIS — Z8619 Personal history of other infectious and parasitic diseases: Secondary | ICD-10-CM

## 2021-01-30 DIAGNOSIS — J3089 Other allergic rhinitis: Secondary | ICD-10-CM | POA: Diagnosis not present

## 2021-01-30 DIAGNOSIS — Z0000 Encounter for general adult medical examination without abnormal findings: Secondary | ICD-10-CM

## 2021-01-30 DIAGNOSIS — Z124 Encounter for screening for malignant neoplasm of cervix: Secondary | ICD-10-CM | POA: Insufficient documentation

## 2021-01-30 DIAGNOSIS — Z113 Encounter for screening for infections with a predominantly sexual mode of transmission: Secondary | ICD-10-CM | POA: Diagnosis not present

## 2021-01-30 DIAGNOSIS — Z114 Encounter for screening for human immunodeficiency virus [HIV]: Secondary | ICD-10-CM

## 2021-01-30 DIAGNOSIS — Z01419 Encounter for gynecological examination (general) (routine) without abnormal findings: Secondary | ICD-10-CM | POA: Diagnosis present

## 2021-01-30 DIAGNOSIS — Z1159 Encounter for screening for other viral diseases: Secondary | ICD-10-CM

## 2021-01-30 LAB — POCT WET PREP (WET MOUNT)
Clue Cells Wet Prep Whiff POC: NEGATIVE
Trichomonas Wet Prep HPF POC: ABSENT

## 2021-01-30 MED ORDER — TETANUS-DIPHTH-ACELL PERTUSSIS 5-2.5-18.5 LF-MCG/0.5 IM SUSY: 0.5000 mL | PREFILLED_SYRINGE | Freq: Once | INTRAMUSCULAR | 0 refills | Status: DC

## 2021-01-30 MED ORDER — LORATADINE 10 MG PO TBDP: 10.0000 mg | ORAL_TABLET | Freq: Every day | ORAL | 6 refills | Status: AC

## 2021-01-30 MED ORDER — FLUTICASONE PROPIONATE 50 MCG/ACT NA SUSP: 2.0000 | Freq: Every day | NASAL | 6 refills | Status: AC

## 2021-01-30 NOTE — Patient Instructions (Signed)
It was a pleasure to see you today!  Thank you for choosing [facility] for your primary care.  [name] was seen for contraception and pap smear.   Our plans for today were:  Today we completed a Pap smear as well as blood work testing for HIV, hepatitis C and a metabolic panel to check your kidney levels.  I will notify you of any abnormal levels.  I recommended she sign up for my chart in order to receive timely results  Your Mirena is approved for another 2 years.  Please notify our office if you have any issues with this.  Today in received a tetanus vaccine you will not need a another dose for 10 years.   You are up-to-date on all of your healthcare maintenance items.  Great job!  You should return to our clinic as needed.  Best Wishes,   Dr. Neita Garnet

## 2021-01-31 DIAGNOSIS — Z0000 Encounter for general adult medical examination without abnormal findings: Secondary | ICD-10-CM | POA: Insufficient documentation

## 2021-01-31 NOTE — Assessment & Plan Note (Signed)
Completed pap smear, will f/u with results once available  Declines influenza vaccine  Patient agreeable to Hep C and HIV screening however listed as family planning insurance so will hold off until insurance plan is clarified

## 2021-02-01 DIAGNOSIS — J309 Allergic rhinitis, unspecified: Secondary | ICD-10-CM | POA: Insufficient documentation

## 2021-02-01 NOTE — Assessment & Plan Note (Signed)
Patient reports having mirena placed in 2017 and would like to have it replaced as she has reached the five year mark. She has been amenorrheic for three years.  Informed patient of approval for 7 years with mirena  Patient would like to replace it in 2 years

## 2021-02-01 NOTE — Assessment & Plan Note (Signed)
Controlled on flonase and claritin  Provided prescription for above

## 2021-02-02 LAB — CERVICOVAGINAL ANCILLARY ONLY
Chlamydia: NEGATIVE
Comment: NEGATIVE
Comment: NORMAL
Neisseria Gonorrhea: NEGATIVE

## 2021-02-02 LAB — CYTOLOGY - PAP
Comment: NEGATIVE
Diagnosis: NEGATIVE
High risk HPV: NEGATIVE

## 2021-02-03 ENCOUNTER — Encounter: Payer: Self-pay | Admitting: Family Medicine

## 2022-05-04 ENCOUNTER — Encounter: Payer: Self-pay | Admitting: *Deleted

## 2022-09-30 ENCOUNTER — Encounter: Payer: Medicaid Other | Admitting: Student

## 2023-02-25 ENCOUNTER — Encounter (HOSPITAL_COMMUNITY): Payer: Self-pay

## 2023-02-25 ENCOUNTER — Ambulatory Visit (HOSPITAL_COMMUNITY)
Admission: EM | Admit: 2023-02-25 | Discharge: 2023-02-25 | Disposition: A | Discharge status: Home / Self Care | Payer: Medicaid Other | Attending: Internal Medicine | Admitting: Internal Medicine

## 2023-02-25 DIAGNOSIS — S61309A Unspecified open wound of unspecified finger with damage to nail, initial encounter: Secondary | ICD-10-CM | POA: Diagnosis not present

## 2023-02-25 MED ORDER — BACITRACIN ZINC 500 UNIT/GM EX OINT: TOPICAL_OINTMENT | Freq: Once | CUTANEOUS | Status: DC

## 2023-02-25 NOTE — ED Triage Notes (Signed)
Ripped nail bed off left ring finger. Done like 40 min ago.

## 2023-02-25 NOTE — Discharge Instructions (Addendum)
Apply ointment to the missing fingernail bed and keep this covered with nonstick gauze and coban wrap.  Change dressing twice daily.  Monitor site for signs of infection such as redness, worsening pain, white/yellow drainage, and swelling.  Return to urgent care if you notice these signs of infection.   The nail will likely start to grow out soon.  Schedule an appointment with the orthopedic provider listed on your paperwork for follow-up evaluation as needed.  Take ibuprofen/tylenol as needed for pain.  You may soak your finger in epsom salt soaks to reduce swelling and inflammation as needed.  If you develop any new or worsening symptoms or do not improve in the next 2 to 3 days, please return.  If your symptoms are severe, please go to the emergency room.  Follow-up with your primary care provider for further evaluation and management of your symptoms as well as ongoing wellness visits.  I hope you feel better!
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
